# Patient Record
Sex: Male | Born: 1995
Health system: Southern US, Community
[De-identification: ages and names within clinical notes are randomized; demographics above are authoritative.]

## PROBLEM LIST (undated history)

## (undated) DIAGNOSIS — F419 Anxiety disorder, unspecified: Secondary | ICD-10-CM

## (undated) DIAGNOSIS — L80 Vitiligo: Secondary | ICD-10-CM

## (undated) HISTORY — DX: Vitiligo: L80

---
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
## 2017-04-02 ENCOUNTER — Encounter (HOSPITAL_COMMUNITY): Payer: Self-pay | Admitting: Emergency Medicine

## 2017-04-02 ENCOUNTER — Ambulatory Visit (INDEPENDENT_AMBULATORY_CARE_PROVIDER_SITE_OTHER): Payer: BLUE CROSS/BLUE SHIELD

## 2017-04-02 ENCOUNTER — Ambulatory Visit (HOSPITAL_COMMUNITY)
Admission: EM | Admit: 2017-04-02 | Discharge: 2017-04-02 | Disposition: A | Discharge status: Home / Self Care | Payer: BLUE CROSS/BLUE SHIELD | Attending: Family Medicine | Admitting: Family Medicine

## 2017-04-02 DIAGNOSIS — S82431A Displaced oblique fracture of shaft of right fibula, initial encounter for closed fracture: Secondary | ICD-10-CM | POA: Diagnosis not present

## 2017-04-02 DIAGNOSIS — M25571 Pain in right ankle and joints of right foot: Secondary | ICD-10-CM | POA: Diagnosis not present

## 2017-04-02 DIAGNOSIS — S82891A Other fracture of right lower leg, initial encounter for closed fracture: Secondary | ICD-10-CM

## 2017-04-02 DIAGNOSIS — S82831A Other fracture of upper and lower end of right fibula, initial encounter for closed fracture: Secondary | ICD-10-CM | POA: Diagnosis not present

## 2017-04-02 DIAGNOSIS — W101XXA Fall (on)(from) sidewalk curb, initial encounter: Secondary | ICD-10-CM | POA: Diagnosis not present

## 2017-04-02 MED ORDER — KETOROLAC TROMETHAMINE 60 MG/2ML IM SOLN
60.0000 mg | Freq: Once | INTRAMUSCULAR | Status: AC
  Administered 2017-04-02: 60 mg via INTRAMUSCULAR

## 2017-04-02 MED ORDER — KETOROLAC TROMETHAMINE 60 MG/2ML IM SOLN
INTRAMUSCULAR | Status: AC
  Filled 2017-04-02: qty 2

## 2017-04-02 MED ORDER — HYDROCODONE-ACETAMINOPHEN 5-325 MG PO TABS: 2.0000 | ORAL_TABLET | Freq: Four times a day (QID) | ORAL | 0 refills | Status: DC | PRN

## 2017-04-02 NOTE — Discharge Instructions (Addendum)
You may take 500mg  Tylenol with ibuprofen 600mg  every 6 hours for pain and inflammation. Use hydrocodone if Tylenol and ibuprofen are not controlling your pain. Please contact your insurance provider to check for a consult with an orthopedic practice. They will take over care of your ankle fracture. Use your crutches in the meantime, do not bear weight on your right foot.

## 2017-04-02 NOTE — ED Triage Notes (Signed)
PT C/O: reports he inverted right ankle this morning around 0830 - 0900 while stepping off a curb.   Sx include: swelling and pain  TAKING MEDS: none   A&O x4... NAD... Ambulatory

## 2017-04-02 NOTE — ED Provider Notes (Signed)
  MRN: [id number] DOB: 04-27-1995  Subjective:   Zachary Gill is a 22 y.o. male presenting for suffering a right ankle injury today. Patient stepped off a curb, rolled his right ankle, felt a pop and immediate severe sharp pain. Pain has been constant. He came to our clinic immediately.   Zachary Gill has No Known Allergies.  Zachary Gill  has no past medical history on file. Also  has a past surgical history that includes laparoscopic appendectomy (N/A, 08/01/2015).  Objective:   Vitals: BP 134/74 (BP Location: Left Arm)   Pulse 84   Temp 98.5 F (36.9 C) (Oral)   Resp 20   SpO2 97%   Physical Exam  Constitutional: He is oriented to person, place, and time. He appears well-developed and well-nourished.  Cardiovascular: Normal rate.  Pulmonary/Chest: Effort normal.  Musculoskeletal:       Right ankle: He exhibits decreased range of motion, swelling and ecchymosis. He exhibits no deformity, no laceration and normal pulse. Tenderness. Lateral malleolus, medial malleolus and AITFL tenderness found. No CF ligament, no posterior TFL, no head of 5th metatarsal and no proximal fibula tenderness found. Achilles tendon exhibits no pain and no defect.  Neurological: He is alert and oriented to person, place, and time.   Dg Ankle Complete Right  Result Date: 04/02/2017 CLINICAL DATA:  Pain following rolling type injury EXAM: RIGHT ANKLE - COMPLETE 3+ VIEW COMPARISON:  None. FINDINGS: Frontal, oblique, and lateral views obtained. There is soft tissue swelling laterally. There is an obliquely oriented fracture of the distal fibula with alignment near anatomic in this area. No other fracture is evident. There is a joint effusion. Ankle mortise appears grossly intact. No appreciable joint space narrowing or erosion. IMPRESSION: Obliquely oriented fracture distal fibula with overall alignment near anatomic. Marked soft tissue swelling laterally with joint effusion. Suspect superimposed ligamentous injury. Ankle  mortise appears grossly intact. No apparent arthropathy. These results will be called to the ordering clinician or representative by the Radiologist Assistant, and communication documented in the PACS or zVision Dashboard. Electronically Signed   By: Bretta BangWilliam  Woodruff III M.D.   On: 04/02/2017 11:33     Assessment and Plan :   Closed fracture of right ankle, initial encounter  Acute right ankle pain  Posterior splint applied. Patient is to be non-weight bearing, use APAP and ibu for pain. Hydrocodone for breakthrough pain. He will follow up with an orthopedic practice for management of his ankle fracture.   Wallis BambergMani, Akari Defelice, PA-C 04/02/17 1203

## 2017-04-04 DIAGNOSIS — S8261XD Displaced fracture of lateral malleolus of right fibula, subsequent encounter for closed fracture with routine healing: Secondary | ICD-10-CM | POA: Diagnosis not present

## 2017-04-04 DIAGNOSIS — M25571 Pain in right ankle and joints of right foot: Secondary | ICD-10-CM | POA: Diagnosis not present

## 2017-04-09 DIAGNOSIS — R6889 Other general symptoms and signs: Secondary | ICD-10-CM | POA: Diagnosis not present

## 2017-04-09 DIAGNOSIS — J101 Influenza due to other identified influenza virus with other respiratory manifestations: Secondary | ICD-10-CM | POA: Diagnosis not present

## 2017-04-18 DIAGNOSIS — S8261XD Displaced fracture of lateral malleolus of right fibula, subsequent encounter for closed fracture with routine healing: Secondary | ICD-10-CM | POA: Diagnosis not present

## 2017-04-23 DIAGNOSIS — J069 Acute upper respiratory infection, unspecified: Secondary | ICD-10-CM | POA: Diagnosis not present

## 2017-05-06 DIAGNOSIS — L739 Follicular disorder, unspecified: Secondary | ICD-10-CM | POA: Diagnosis not present

## 2017-05-16 DIAGNOSIS — S8261XD Displaced fracture of lateral malleolus of right fibula, subsequent encounter for closed fracture with routine healing: Secondary | ICD-10-CM | POA: Diagnosis not present

## 2017-05-16 DIAGNOSIS — M79671 Pain in right foot: Secondary | ICD-10-CM | POA: Diagnosis not present

## 2017-06-13 DIAGNOSIS — S8261XD Displaced fracture of lateral malleolus of right fibula, subsequent encounter for closed fracture with routine healing: Secondary | ICD-10-CM | POA: Diagnosis not present

## 2017-06-13 DIAGNOSIS — M79671 Pain in right foot: Secondary | ICD-10-CM | POA: Diagnosis not present

## 2017-06-13 DIAGNOSIS — M25571 Pain in right ankle and joints of right foot: Secondary | ICD-10-CM | POA: Diagnosis not present

## 2017-08-10 DIAGNOSIS — F332 Major depressive disorder, recurrent severe without psychotic features: Secondary | ICD-10-CM | POA: Diagnosis not present

## 2017-08-16 DIAGNOSIS — F332 Major depressive disorder, recurrent severe without psychotic features: Secondary | ICD-10-CM | POA: Diagnosis not present

## 2017-08-30 DIAGNOSIS — F332 Major depressive disorder, recurrent severe without psychotic features: Secondary | ICD-10-CM | POA: Diagnosis not present

## 2017-09-05 DIAGNOSIS — L80 Vitiligo: Secondary | ICD-10-CM | POA: Diagnosis not present

## 2017-09-06 DIAGNOSIS — F332 Major depressive disorder, recurrent severe without psychotic features: Secondary | ICD-10-CM | POA: Diagnosis not present

## 2017-09-12 DIAGNOSIS — F332 Major depressive disorder, recurrent severe without psychotic features: Secondary | ICD-10-CM | POA: Diagnosis not present

## 2017-09-22 DIAGNOSIS — F419 Anxiety disorder, unspecified: Secondary | ICD-10-CM | POA: Diagnosis not present

## 2017-09-22 DIAGNOSIS — R454 Irritability and anger: Secondary | ICD-10-CM | POA: Diagnosis not present

## 2017-09-22 DIAGNOSIS — F3289 Other specified depressive episodes: Secondary | ICD-10-CM | POA: Diagnosis not present

## 2017-11-05 DIAGNOSIS — R6883 Chills (without fever): Secondary | ICD-10-CM | POA: Diagnosis not present

## 2017-11-07 DIAGNOSIS — R112 Nausea with vomiting, unspecified: Secondary | ICD-10-CM | POA: Diagnosis not present

## 2017-11-08 ENCOUNTER — Encounter (HOSPITAL_COMMUNITY): Admission: EM | Disposition: A | Discharge status: Home / Self Care | Payer: Self-pay | Source: Home / Self Care

## 2017-11-08 ENCOUNTER — Emergency Department (HOSPITAL_COMMUNITY): Payer: BLUE CROSS/BLUE SHIELD | Admitting: Anesthesiology

## 2017-11-08 ENCOUNTER — Emergency Department (HOSPITAL_COMMUNITY): Payer: BLUE CROSS/BLUE SHIELD

## 2017-11-08 ENCOUNTER — Inpatient Hospital Stay (HOSPITAL_COMMUNITY)
Admission: EM | Admit: 2017-11-08 | Discharge: 2017-11-14 | DRG: 987 | Disposition: A | Discharge status: Home / Self Care | Payer: BLUE CROSS/BLUE SHIELD | Attending: Internal Medicine | Admitting: Internal Medicine

## 2017-11-08 ENCOUNTER — Other Ambulatory Visit: Payer: Self-pay

## 2017-11-08 ENCOUNTER — Encounter (HOSPITAL_COMMUNITY): Payer: Self-pay | Admitting: Emergency Medicine

## 2017-11-08 DIAGNOSIS — Z79899 Other long term (current) drug therapy: Secondary | ICD-10-CM | POA: Diagnosis not present

## 2017-11-08 DIAGNOSIS — J9601 Acute respiratory failure with hypoxia: Secondary | ICD-10-CM | POA: Diagnosis not present

## 2017-11-08 DIAGNOSIS — J9690 Respiratory failure, unspecified, unspecified whether with hypoxia or hypercapnia: Secondary | ICD-10-CM | POA: Diagnosis not present

## 2017-11-08 DIAGNOSIS — Z23 Encounter for immunization: Secondary | ICD-10-CM | POA: Diagnosis not present

## 2017-11-08 DIAGNOSIS — F1210 Cannabis abuse, uncomplicated: Secondary | ICD-10-CM | POA: Diagnosis present

## 2017-11-08 DIAGNOSIS — Z4682 Encounter for fitting and adjustment of non-vascular catheter: Secondary | ICD-10-CM | POA: Diagnosis not present

## 2017-11-08 DIAGNOSIS — K561 Intussusception: Secondary | ICD-10-CM | POA: Diagnosis present

## 2017-11-08 DIAGNOSIS — Z79891 Long term (current) use of opiate analgesic: Secondary | ICD-10-CM | POA: Diagnosis not present

## 2017-11-08 DIAGNOSIS — R1110 Vomiting, unspecified: Secondary | ICD-10-CM | POA: Diagnosis not present

## 2017-11-08 DIAGNOSIS — J90 Pleural effusion, not elsewhere classified: Secondary | ICD-10-CM | POA: Diagnosis present

## 2017-11-08 DIAGNOSIS — R112 Nausea with vomiting, unspecified: Secondary | ICD-10-CM | POA: Diagnosis not present

## 2017-11-08 DIAGNOSIS — Z9049 Acquired absence of other specified parts of digestive tract: Secondary | ICD-10-CM | POA: Diagnosis not present

## 2017-11-08 DIAGNOSIS — Z4659 Encounter for fitting and adjustment of other gastrointestinal appliance and device: Secondary | ICD-10-CM

## 2017-11-08 DIAGNOSIS — R079 Chest pain, unspecified: Secondary | ICD-10-CM | POA: Diagnosis not present

## 2017-11-08 DIAGNOSIS — L80 Vitiligo: Secondary | ICD-10-CM | POA: Diagnosis not present

## 2017-11-08 DIAGNOSIS — J984 Other disorders of lung: Secondary | ICD-10-CM | POA: Diagnosis not present

## 2017-11-08 DIAGNOSIS — Z789 Other specified health status: Secondary | ICD-10-CM | POA: Diagnosis not present

## 2017-11-08 DIAGNOSIS — J189 Pneumonia, unspecified organism: Secondary | ICD-10-CM

## 2017-11-08 DIAGNOSIS — R509 Fever, unspecified: Secondary | ICD-10-CM

## 2017-11-08 DIAGNOSIS — R0602 Shortness of breath: Secondary | ICD-10-CM | POA: Diagnosis not present

## 2017-11-08 DIAGNOSIS — F17290 Nicotine dependence, other tobacco product, uncomplicated: Secondary | ICD-10-CM | POA: Diagnosis not present

## 2017-11-08 DIAGNOSIS — R918 Other nonspecific abnormal finding of lung field: Secondary | ICD-10-CM | POA: Diagnosis not present

## 2017-11-08 DIAGNOSIS — F1990 Other psychoactive substance use, unspecified, uncomplicated: Secondary | ICD-10-CM | POA: Diagnosis not present

## 2017-11-08 DIAGNOSIS — F1290 Cannabis use, unspecified, uncomplicated: Secondary | ICD-10-CM | POA: Diagnosis not present

## 2017-11-08 HISTORY — PX: LAPAROSCOPY: SHX197

## 2017-11-08 LAB — URINALYSIS, ROUTINE W REFLEX MICROSCOPIC
Bacteria, UA: NONE SEEN
Bilirubin Urine: NEGATIVE
GLUCOSE, UA: NEGATIVE mg/dL
Hgb urine dipstick: NEGATIVE
Ketones, ur: 80 mg/dL — AB
Leukocytes, UA: NEGATIVE
Nitrite: NEGATIVE
PH: 8 (ref 5.0–8.0)
Protein, ur: >=300 mg/dL — AB
SPECIFIC GRAVITY, URINE: 1.035 — AB (ref 1.005–1.030)

## 2017-11-08 LAB — CBC
HCT: 39.1 % (ref 39.0–52.0)
Hemoglobin: 13.6 g/dL (ref 13.0–17.0)
MCH: 32.9 pg (ref 26.0–34.0)
MCHC: 34.8 g/dL (ref 30.0–36.0)
MCV: 94.7 fL (ref 78.0–100.0)
PLATELETS: 283 10*3/uL (ref 150–400)
RBC: 4.13 MIL/uL — AB (ref 4.22–5.81)
RDW: 12.1 % (ref 11.5–15.5)
WBC: 15.8 10*3/uL — ABNORMAL HIGH (ref 4.0–10.5)

## 2017-11-08 LAB — COMPREHENSIVE METABOLIC PANEL
ALK PHOS: 118 U/L (ref 38–126)
ALT: 18 U/L (ref 0–44)
AST: 26 U/L (ref 15–41)
Albumin: 3.1 g/dL — ABNORMAL LOW (ref 3.5–5.0)
Anion gap: 15 (ref 5–15)
BUN: 13 mg/dL (ref 6–20)
CALCIUM: 9.6 mg/dL (ref 8.9–10.3)
CO2: 23 mmol/L (ref 22–32)
CREATININE: 0.99 mg/dL (ref 0.61–1.24)
Chloride: 103 mmol/L (ref 98–111)
GFR calc non Af Amer: >60 mL/min (ref >60)
Glucose, Bld: 126 mg/dL — ABNORMAL HIGH (ref 70–99)
Potassium: 3.8 mmol/L (ref 3.5–5.1)
SODIUM: 141 mmol/L (ref 135–145)
Total Bilirubin: 1.3 mg/dL — ABNORMAL HIGH (ref 0.3–1.2)
Total Protein: 7 g/dL (ref 6.5–8.1)

## 2017-11-08 LAB — I-STAT CG4 LACTIC ACID, ED
LACTIC ACID, VENOUS: 1.33 mmol/L (ref 0.5–1.9)
Lactic Acid, Venous: 3.02 mmol/L (ref 0.5–1.9)

## 2017-11-08 LAB — LIPASE, BLOOD: LIPASE: 23 U/L (ref 11–51)

## 2017-11-08 SURGERY — LAPAROSCOPY, DIAGNOSTIC
Anesthesia: General | Site: Abdomen

## 2017-11-08 SURGERY — LAPAROSCOPY, DIAGNOSTIC
Anesthesia: General

## 2017-11-08 MED ORDER — OXYCODONE HCL 5 MG PO TABS: 5.0000 mg | ORAL_TABLET | Freq: Once | ORAL | Status: DC | PRN

## 2017-11-08 MED ORDER — MIDAZOLAM HCL 2 MG/2ML IJ SOLN
INTRAMUSCULAR | Status: AC
  Filled 2017-11-08: qty 2

## 2017-11-08 MED ORDER — SODIUM CHLORIDE 0.9 % IV SOLN
2.0000 g | Freq: Once | INTRAVENOUS | Status: AC
  Administered 2017-11-08: 2 g via INTRAVENOUS
  Filled 2017-11-08: qty 20

## 2017-11-08 MED ORDER — FENTANYL CITRATE (PF) 250 MCG/5ML IJ SOLN
INTRAMUSCULAR | Status: AC
  Filled 2017-11-08: qty 5

## 2017-11-08 MED ORDER — SODIUM CHLORIDE 0.9 % IV SOLN
500.0000 mg | Freq: Once | INTRAVENOUS | Status: DC
  Filled 2017-11-08: qty 500

## 2017-11-08 MED ORDER — ARTIFICIAL TEARS OPHTHALMIC OINT
TOPICAL_OINTMENT | OPHTHALMIC | Status: AC
  Filled 2017-11-08: qty 3.5

## 2017-11-08 MED ORDER — LIDOCAINE 2% (20 MG/ML) 5 ML SYRINGE
INTRAMUSCULAR | Status: AC
  Filled 2017-11-08: qty 5

## 2017-11-08 MED ORDER — ROCURONIUM BROMIDE 50 MG/5ML IV SOSY
PREFILLED_SYRINGE | INTRAVENOUS | Status: DC | PRN
  Administered 2017-11-08: 20 mg via INTRAVENOUS
  Administered 2017-11-08: 40 mg via INTRAVENOUS
  Administered 2017-11-08: 10 mg via INTRAVENOUS

## 2017-11-08 MED ORDER — SODIUM CHLORIDE 0.9 % IV BOLUS
1000.0000 mL | Freq: Once | INTRAVENOUS | Status: AC
  Administered 2017-11-08: 1000 mL via INTRAVENOUS

## 2017-11-08 MED ORDER — ROCURONIUM BROMIDE 50 MG/5ML IV SOSY
PREFILLED_SYRINGE | INTRAVENOUS | Status: AC
  Filled 2017-11-08: qty 5

## 2017-11-08 MED ORDER — ONDANSETRON HCL 4 MG/2ML IJ SOLN
4.0000 mg | Freq: Once | INTRAMUSCULAR | Status: AC
  Administered 2017-11-08: 4 mg via INTRAVENOUS
  Filled 2017-11-08: qty 2

## 2017-11-08 MED ORDER — SODIUM CHLORIDE 0.9 % IV SOLN
INTRAVENOUS | Status: DC
  Administered 2017-11-09: 14:00:00 via INTRAVENOUS
  Administered 2017-11-09: 100 mL/h via INTRAVENOUS
  Administered 2017-11-10 (×2): via INTRAVENOUS
  Administered 2017-11-11: 1000 mL via INTRAVENOUS

## 2017-11-08 MED ORDER — PROPOFOL 10 MG/ML IV BOLUS
INTRAVENOUS | Status: DC | PRN
  Administered 2017-11-08: 200 mg via INTRAVENOUS

## 2017-11-08 MED ORDER — SUCCINYLCHOLINE CHLORIDE 20 MG/ML IJ SOLN
INTRAMUSCULAR | Status: DC | PRN
  Administered 2017-11-08: 120 mg via INTRAVENOUS

## 2017-11-08 MED ORDER — FENTANYL CITRATE (PF) 100 MCG/2ML IJ SOLN
INTRAMUSCULAR | Status: DC | PRN
  Administered 2017-11-08: 25 ug via INTRAVENOUS
  Administered 2017-11-08: 50 ug via INTRAVENOUS
  Administered 2017-11-08: 25 ug via INTRAVENOUS
  Administered 2017-11-08: 50 ug via INTRAVENOUS
  Administered 2017-11-08: 75 ug via INTRAVENOUS
  Administered 2017-11-08 (×2): 25 ug via INTRAVENOUS

## 2017-11-08 MED ORDER — SODIUM CHLORIDE 0.9 % IV SOLN
Freq: Once | INTRAVENOUS | Status: AC
  Administered 2017-11-08: 21:00:00 via INTRAVENOUS

## 2017-11-08 MED ORDER — BUPIVACAINE-EPINEPHRINE 0.25% -1:200000 IJ SOLN
INTRAMUSCULAR | Status: DC | PRN
  Administered 2017-11-08: 17 mL

## 2017-11-08 MED ORDER — DEXAMETHASONE SODIUM PHOSPHATE 10 MG/ML IJ SOLN
INTRAMUSCULAR | Status: AC
  Filled 2017-11-08: qty 1

## 2017-11-08 MED ORDER — ARTIFICIAL TEARS OPHTHALMIC OINT
TOPICAL_OINTMENT | OPHTHALMIC | Status: DC | PRN
  Administered 2017-11-08: 1 via OPHTHALMIC

## 2017-11-08 MED ORDER — BUPIVACAINE-EPINEPHRINE (PF) 0.25% -1:200000 IJ SOLN
INTRAMUSCULAR | Status: AC
  Filled 2017-11-08: qty 30

## 2017-11-08 MED ORDER — ONDANSETRON HCL 4 MG/2ML IJ SOLN
INTRAMUSCULAR | Status: DC | PRN
  Administered 2017-11-08: 4 mg via INTRAVENOUS

## 2017-11-08 MED ORDER — ACETAMINOPHEN 325 MG PO TABS
650.0000 mg | ORAL_TABLET | Freq: Once | ORAL | Status: AC
  Administered 2017-11-08: 650 mg via ORAL
  Filled 2017-11-08: qty 2

## 2017-11-08 MED ORDER — 0.9 % SODIUM CHLORIDE (POUR BTL) OPTIME
TOPICAL | Status: DC | PRN
  Administered 2017-11-08: 1000 mL

## 2017-11-08 MED ORDER — OXYCODONE HCL 5 MG/5ML PO SOLN: 5.0000 mg | Freq: Once | ORAL | Status: DC | PRN

## 2017-11-08 MED ORDER — FENTANYL CITRATE (PF) 100 MCG/2ML IJ SOLN
25.0000 ug | INTRAMUSCULAR | Status: DC | PRN
  Administered 2017-11-09: 50 ug via INTRAVENOUS

## 2017-11-08 MED ORDER — MIDAZOLAM HCL 5 MG/5ML IJ SOLN
INTRAMUSCULAR | Status: DC | PRN
  Administered 2017-11-08: 2 mg via INTRAVENOUS

## 2017-11-08 MED ORDER — ONDANSETRON HCL 4 MG/2ML IJ SOLN: 4.0000 mg | Freq: Four times a day (QID) | INTRAMUSCULAR | Status: DC | PRN

## 2017-11-08 MED ORDER — LIDOCAINE 2% (20 MG/ML) 5 ML SYRINGE
INTRAMUSCULAR | Status: DC | PRN
  Administered 2017-11-08: 60 mg via INTRAVENOUS

## 2017-11-08 MED ORDER — SUCCINYLCHOLINE CHLORIDE 200 MG/10ML IV SOSY
PREFILLED_SYRINGE | INTRAVENOUS | Status: AC
  Filled 2017-11-08: qty 10

## 2017-11-08 MED ORDER — DEXAMETHASONE SODIUM PHOSPHATE 10 MG/ML IJ SOLN
INTRAMUSCULAR | Status: DC | PRN
  Administered 2017-11-08: 5 mg via INTRAVENOUS

## 2017-11-08 MED ORDER — ONDANSETRON HCL 4 MG/2ML IJ SOLN
INTRAMUSCULAR | Status: AC
  Filled 2017-11-08: qty 2

## 2017-11-08 MED ORDER — SUGAMMADEX SODIUM 200 MG/2ML IV SOLN
INTRAVENOUS | Status: DC | PRN
  Administered 2017-11-08: 150 mg via INTRAVENOUS

## 2017-11-08 MED ORDER — GLYCOPYRROLATE 0.2 MG/ML IJ SOLN
INTRAMUSCULAR | Status: DC | PRN
  Administered 2017-11-08: 0.2 mg via INTRAVENOUS

## 2017-11-08 MED ORDER — LACTATED RINGERS IV SOLN
INTRAVENOUS | Status: DC | PRN
  Administered 2017-11-08 (×2): via INTRAVENOUS

## 2017-11-08 MED ORDER — SODIUM CHLORIDE 0.9 % IV SOLN
2.0000 g | INTRAVENOUS | Status: AC
  Administered 2017-11-09 – 2017-11-13 (×5): 2 g via INTRAVENOUS
  Filled 2017-11-08 (×9): qty 20

## 2017-11-08 MED ORDER — IOPAMIDOL (ISOVUE-300) INJECTION 61%
INTRAVENOUS | Status: AC
  Administered 2017-11-08: 100 mL
  Filled 2017-11-08: qty 100

## 2017-11-08 SURGICAL SUPPLY — 37 items
BLADE CLIPPER SURG (BLADE) ×3 IMPLANT
CANISTER SUCT 3000ML PPV (MISCELLANEOUS) ×3 IMPLANT
CHLORAPREP W/TINT 26ML (MISCELLANEOUS) ×3 IMPLANT
COVER SURGICAL LIGHT HANDLE (MISCELLANEOUS) ×3 IMPLANT
DECANTER SPIKE VIAL GLASS SM (MISCELLANEOUS) ×3 IMPLANT
DERMABOND ADVANCED (GAUZE/BANDAGES/DRESSINGS) ×1
DERMABOND ADVANCED .7 DNX12 (GAUZE/BANDAGES/DRESSINGS) ×2 IMPLANT
DRAPE LAPAROSCOPIC ABDOMINAL (DRAPES) ×3 IMPLANT
DRSG TEGADERM 2-3/8X2-3/4 SM (GAUZE/BANDAGES/DRESSINGS) ×9 IMPLANT
ELECT CAUTERY BLADE 6.4 (BLADE) ×6 IMPLANT
ELECT REM PT RETURN 9FT ADLT (ELECTROSURGICAL) ×3
ELECTRODE REM PT RTRN 9FT ADLT (ELECTROSURGICAL) ×2 IMPLANT
GLOVE BIOGEL PI IND STRL 7.0 (GLOVE) ×2 IMPLANT
GLOVE BIOGEL PI IND STRL 8 (GLOVE) ×2 IMPLANT
GLOVE BIOGEL PI INDICATOR 7.0 (GLOVE) ×1
GLOVE BIOGEL PI INDICATOR 8 (GLOVE) ×1
GLOVE ECLIPSE 7.5 STRL STRAW (GLOVE) ×3 IMPLANT
GOWN STRL REUS W/ TWL LRG LVL3 (GOWN DISPOSABLE) ×2 IMPLANT
GOWN STRL REUS W/TWL LRG LVL3 (GOWN DISPOSABLE) ×1
KIT BASIN OR (CUSTOM PROCEDURE TRAY) ×3 IMPLANT
KIT TURNOVER KIT B (KITS) ×3 IMPLANT
NS IRRIG 1000ML POUR BTL (IV SOLUTION) ×3 IMPLANT
PAD ARMBOARD 7.5X6 YLW CONV (MISCELLANEOUS) ×6 IMPLANT
PENCIL BUTTON HOLSTER BLD 10FT (ELECTRODE) ×3 IMPLANT
SLEEVE ENDOPATH XCEL 5M (ENDOMECHANICALS) ×3 IMPLANT
STRIP CLOSURE SKIN 1/2X4 (GAUZE/BANDAGES/DRESSINGS) ×3 IMPLANT
SUT MNCRL AB 4-0 PS2 18 (SUTURE) ×3 IMPLANT
TOWEL OR 17X24 6PK STRL BLUE (TOWEL DISPOSABLE) ×3 IMPLANT
TOWEL OR 17X26 10 PK STRL BLUE (TOWEL DISPOSABLE) ×3 IMPLANT
TRAY FOLEY MTR SLVR 16FR STAT (SET/KITS/TRAYS/PACK) IMPLANT
TRAY FOLEY W/BAG SLVR 14FR (SET/KITS/TRAYS/PACK) ×3 IMPLANT
TRAY LAPAROSCOPIC MC (CUSTOM PROCEDURE TRAY) ×3 IMPLANT
TROCAR XCEL NON-BLD 11X100MML (ENDOMECHANICALS) ×3 IMPLANT
TROCAR XCEL NON-BLD 5MMX100MML (ENDOMECHANICALS) ×3 IMPLANT
TUBE CONNECTING 12X1/4 (SUCTIONS) ×3 IMPLANT
TUBING INSUFFLATION (TUBING) ×3 IMPLANT
YANKAUER SUCT BULB TIP NO VENT (SUCTIONS) ×3 IMPLANT

## 2017-11-08 NOTE — ED Notes (Signed)
Patient transported to CT 

## 2017-11-08 NOTE — Progress Notes (Signed)
Unable to maintain O2 sat above 89-90 with 4 L [place]. Dr. Chaney MallingHodierne called and changed to venti mask at 10 L flow 45%. o2 sat increased to 92 % pt color pink . No distress

## 2017-11-08 NOTE — Anesthesia Procedure Notes (Signed)
Procedure Name: Intubation Date/Time: 11/08/2017 9:08 PM Performed by: Edmonia CaprioAuston, Xavior Niazi M, CRNA Pre-anesthesia Checklist: Patient identified, Emergency Drugs available, Suction available, Timeout performed and Patient being monitored Patient Re-evaluated:Patient Re-evaluated prior to induction Oxygen Delivery Method: Circle system utilized Preoxygenation: Pre-oxygenation with 100% oxygen Induction Type: Rapid sequence, Cricoid Pressure applied and IV induction Laryngoscope Size: Miller and 2 Grade View: Grade I Tube type: Oral Tube size: 7.5 mm Number of attempts: 1 Airway Equipment and Method: Stylet Placement Confirmation: ETT inserted through vocal cords under direct vision,  positive ETCO2 and breath sounds checked- equal and bilateral Secured at: 22 cm Tube secured with: Tape Dental Injury: Teeth and Oropharynx as per pre-operative assessment

## 2017-11-08 NOTE — ED Triage Notes (Signed)
Pt. Stated. Im dying, Lavenia Atlasve been to Dr x 2 with chills, fever, N/V since Tuesday. Im PROBABLY DEHYDRATED and I can't even take medication.

## 2017-11-08 NOTE — ED Notes (Signed)
Patient transported to X-ray 

## 2017-11-08 NOTE — ED Provider Notes (Signed)
[facility] EMERGENCY DEPARTMENT Provider Note   CSN: [id number] Arrival date & time: 11/08/17  1336     History   Chief Complaint Chief Complaint  Patient presents with  . Emesis  . Nausea  . Fever    HPI Zachary Gill is a 22 y.o. male.  22 y/o male with no PMH presents to the ED with a chief complaint of "feeling hot" x 4 days. Patient states he began to feel hot 4 days ago. However, two days ago he began to feel nauseated and reports multiple episodes of emesis, unable to tolerate fluids or solids. He states he's been feeling weak, has a headache, and post tusive emesis along with generalized body aches. He states he's just feeling unwell.Mother has given patient tylenol and motrin symptomatic control of his fever. Patient does report mother was recently sick, she was seen in the hospital for colitis last week and treated with antibiotics. He denies any chest pain, shortness of breath, diarrhea, neck rigidity. He does have a previous surgical history of appendectomy.      History reviewed. No pertinent past medical history.  Patient Active Problem List   Diagnosis Date Noted  . Acute appendicitis 08/01/2015  . Appendicitis, acute 08/01/2015    Past Surgical History:  Procedure Laterality Date  . LAPAROSCOPIC APPENDECTOMY N/A 08/01/2015   Procedure: APPENDECTOMY LAPAROSCOPIC;  Surgeon: Leonia Corona, MD;  Location: [facility] OR;  Service: Pediatrics;  Laterality: N/A;        Home Medications    Prior to Admission medications   Medication Sig Start Date End Date Taking? Authorizing Provider  HYDROcodone-acetaminophen (NORCO/VICODIN) 5-325 MG tablet Take 2 tablets by mouth every 6 (six) hours as needed for severe pain. 04/02/17   Wallis Bamberg, PA-C    Family History No family history on file.  Social History Social History   Tobacco Use  . Smoking status: Never Smoker  . Smokeless tobacco: Never Used  Substance Use Topics  . Alcohol use: Yes      Comment: 14 shots binge drinker  . Drug use: No     Allergies   Patient has no known allergies.   Review of Systems Review of Systems  Constitutional: Positive for chills and fever.  HENT: Negative for postnasal drip, rhinorrhea, sneezing, sore throat and trouble swallowing.   Eyes: Negative for photophobia.  Respiratory: Negative for shortness of breath and wheezing.   Cardiovascular: Negative for chest pain and palpitations.  Gastrointestinal: Positive for abdominal pain, nausea and vomiting. Negative for diarrhea.  Genitourinary: Negative for dysuria and flank pain.  Musculoskeletal: Positive for myalgias. Negative for back pain.  Skin: Negative for rash and wound.  Neurological: Negative for syncope, weakness, light-headedness and headaches.  All other systems reviewed and are negative.    Physical Exam Updated Vital Signs BP (!) 113/46   Pulse 96   Temp (!) 103.2 F (39.6 C) (Oral) Comment: PA notified  Resp 16   Ht 5\' 7"  (1.702 m)   Wt 70.3 kg   SpO2 94%   BMI 24.28 kg/m   Physical Exam  Constitutional: He is oriented to person, place, and time. He appears well-developed and well-nourished. He has a sickly appearance. No distress.  HENT:  Head: Normocephalic and atraumatic.  Mouth/Throat: Uvula is midline and oropharynx is clear and moist. Mucous membranes are dry. No oropharyngeal exudate, posterior oropharyngeal edema, posterior oropharyngeal erythema or tonsillar abscesses. No tonsillar exudate.  Lips appear cracked on presentation.   Eyes: Pupils are  equal, round, and reactive to light. No scleral icterus.  Neck: Normal range of motion.  Cardiovascular: Normal heart sounds.  Pulmonary/Chest: Effort normal and breath sounds normal. He has no wheezes. He exhibits no tenderness.  Abdominal: Soft. Bowel sounds are normal. He exhibits no distension. There is no tenderness.  Musculoskeletal: He exhibits no tenderness or deformity.  Neurological: He is alert  and oriented to person, place, and time.  Skin: Skin is warm and dry.  Nursing note and vitals reviewed.    ED Treatments / Results  Labs (all labs ordered are listed, but only abnormal results are displayed) Labs Reviewed  COMPREHENSIVE METABOLIC PANEL - Abnormal; Notable for the following components:      Result Value   Glucose, Bld 126 (*)    Albumin 3.1 (*)    Total Bilirubin 1.3 (*)    All other components within normal limits  CBC - Abnormal; Notable for the following components:   WBC 15.8 (*)    RBC 4.13 (*)    All other components within normal limits  URINALYSIS, ROUTINE W REFLEX MICROSCOPIC - Abnormal; Notable for the following components:   Color, Urine AMBER (*)    APPearance HAZY (*)    Specific Gravity, Urine 1.035 (*)    Ketones, ur 80 (*)    Protein, ur >=300 (*)    All other components within normal limits  I-STAT CG4 LACTIC ACID, ED - Abnormal; Notable for the following components:   Lactic Acid, Venous 3.02 (*)    All other components within normal limits  CULTURE, BLOOD (ROUTINE X 2)  CULTURE, BLOOD (ROUTINE X 2)  LIPASE, BLOOD  I-STAT CG4 LACTIC ACID, ED    EKG None  Radiology Ct Abdomen Pelvis W Contrast  Result Date: 11/08/2017 CLINICAL DATA:  Nausea and vomiting for 4 days.  Fever and chills. EXAM: CT ABDOMEN AND PELVIS WITH CONTRAST TECHNIQUE: Multidetector CT imaging of the abdomen and pelvis was performed using the standard protocol following bolus administration of intravenous contrast. CONTRAST:  ISOVUE-300 IOPAMIDOL (ISOVUE-300) INJECTION 61% COMPARISON:  None. FINDINGS: Lower chest: Patchy ground-glass opacities scattered in both lung bases, indistinctly marginated. Trace left pleural effusion. Hepatobiliary: Unremarkable Pancreas: Unremarkable Spleen: The spleen measures 10.0 by 5.0 by 13.1 cm (volume = 340 cm^3). No focal splenic abnormality. Adrenals/Urinary Tract: There is contrast medium in the collecting systems on the initial  images, which lower sensitivity in detecting nonobstructive renal calculi. No abnormal renal parenchymal enhancement. Adrenal glands normal. No hydronephrosis or hydroureter. Urinary bladder unremarkable. Stomach/Bowel: Prior appendectomy. There is some scattered air-levels in proximal loops small bowel. A jejuno jejunal intussusception measures about 3.3 cm in length on image 77/7. A specific cause for the intussusception is not well seen. Vascular/Lymphatic: Unremarkable Reproductive: Unremarkable Other: No supplemental non-categorized findings. Musculoskeletal: Unremarkable IMPRESSION: 1. There is an approximately 3.3 cm in length region of jejuno jejunal intussusception in the left upper quadrant. Although a specific cause for this is not well seen, outside of childhood most small bowel intussusceptions have a specific lesion or lead point. There are several scattered air-fluid levels in the proximal small bowel as well, including a borderline dilated loop of jejunum extending to the intussusception. 2. Patchy ground-glass opacities in the lung bases without findings of fibrosis. Possibilities may include atypical pneumonia, acute eosinophilic pneumonia, or acute hypersensitivity reaction. There is no cardiomegaly to suggest a cardiogenic cause. However, there is a trace left pleural effusion. Electronically Signed   By: Annitta Needs.D.  On: 11/08/2017 16:11    Procedures Procedures (including critical care time)  Medications Ordered in ED Medications  acetaminophen (TYLENOL) tablet 650 mg (0 mg Oral Hold 11/08/17 1520)  sodium chloride 0.9 % bolus 1,000 mL (has no administration in time range)  azithromycin (ZITHROMAX) 500 mg in sodium chloride 0.9 % 250 mL IVPB (has no administration in time range)  ondansetron (ZOFRAN) injection 4 mg (4 mg Intravenous Given 11/08/17 1521)  sodium chloride 0.9 % bolus 1,000 mL (0 mLs Intravenous Stopped 11/08/17 1541)  iopamidol (ISOVUE-300) 61 % injection  (100 mLs  Contrast Given 11/08/17 1543)     Initial Impression / Assessment and Plan / ED Course  I have reviewed the triage vital signs and the nursing notes.  Pertinent labs & imaging results that were available during my care of the patient were reviewed by me and considered in my medical decision making (see chart for details).     Patient presents with fever and emesis x 4 days. His CMP showed no electrolyte abnormalities. LFT's are within normal limits. CBC showed leukocytosis at 15.8, patient is currently febrile at 103.2 he has been unable to keep any fluids or solids down for the past 4 days. Lactic acid was 3.02  UA showed no nitrites, leukocytes or bacteria he denies any urinary symptoms.  Zofran, tylenol and bolus ordered.CT abdomen and Pelvis with contrast ordered to r/o infection.  CT Abd & Pelvis showed   1. There is an approximately 3.3 cm in length region of jejuno  jejunal intussusception in the left upper quadrant. Although a  specific cause for this is not well seen, outside of childhood most  small bowel intussusceptions have a specific lesion or lead point.  There are several scattered air-fluid levels in the proximal small  bowel as well, including a borderline dilated loop of jejunum  extending to the intussusception.  2. Patchy ground-glass opacities in the lung bases without findings  of fibrosis. Possibilities may include atypical pneumonia, acute  eosinophilic pneumonia, or acute hypersensitivity reaction. There is  no cardiomegaly to suggest a cardiogenic cause. However, there is a  trace left pleural effusion.    4:35 PM Called placed to surgery, spoke to Dr. Lindie SpruceWyatt who will come in to see patient in the ED. At this time I have started patient on Zithromax for pneumonia, will obtain chest xray.Patient at this time is not symptomatic but fever still elevated.   4:41 PM Patient trasnferred to [employer] K. PA-C at shift change.   Final Clinical  Impressions(s) / ED Diagnoses   Final diagnoses:  Fever, unspecified fever cause    ED Discharge Orders    None       Claude MangesSoto, Hosam Mcfetridge, PA-C 11/08/17 1641    Sabas SousBero, Michael M, MD 11/08/17 1655

## 2017-11-08 NOTE — Op Note (Signed)
OPERATIVE REPORT  DATE OF OPERATION: 11/08/2017  PATIENT:  Zachary Gill  22 y.o. male  PRE-OPERATIVE DIAGNOSIS:  proximal jejunal INTUSSUSCEPTION  POST-OPERATIVE DIAGNOSIS:  Normal small bowel from the ligament of Treitz down to the terminal ileum  INDICATION(S) FOR OPERATION:  Nausea and vomiting and CT scan demonstrating what was thought to be intestinal intussusception  FINDINGS: Normal caliber small bowel from the ligament of Treitz down to the terminal ileum.  No evidence of any infection.  Staple line at the cecum from previous appendectomy is noted.  PROCEDURE:  Procedure(s): LAPAROSCOPY DIAGNOSTIC  SURGEON:  Surgeon(s): Jimmye NormanWyatt, Timon Geissinger, MD  ASSISTANT: None  ANESTHESIA:   general  COMPLICATIONS: None  EBL: 10 ml  BLOOD ADMINISTERED: none  DRAINS: none   SPECIMEN:  No Specimen  COUNTS CORRECT:  YES  PROCEDURE DETAILS: The patient was taken to the operating room and placed on the table in supine position.  After adequate general endotracheal anesthetic was administered, he was prepped and draped in usual sterile manner exposing the abdomen.  A proper timeout was performed identifying the patient and procedure to be performed.  We started with a supraumbilical incision approximately 1/2 cm long down to the midline fascia.  We incised the fascia then subsequently bluntly dissected down into the peritoneal cavity using a Kelly clamp.  A pursestring suture of 0 Vicryl was passed around the fascial opening which secured in place a Hassan cannula which is used insufflated carbon dioxide gas up to a maximal intra-abdominal pressure of 1 5 mmHg.  Right upper quadrant 5 mm cannula in the left lower quadrant 5 mm cannula passed under direct vision.  Once they were all then we placed the patient in multiple orientations noted to get adequate visualization of small bowel.  We started by the patient in Trendelenburg with the left side down looking in the right lower quadrant.  At that  point we could see the staple line from the previous appendectomy we started the terminal ileum and around the small bowel using noncrushing bowel clamps to run the small bowel from the terminal ileum up to the ligament of Treitz.  Although there was some fluid-filled loops of the right lower quadrant which looked initially as if there was some obstruction these appeared to be normal terminal ileal bowel loops and there was no evidence of obstruction and no intussusception.  In various orientations including head of the head down right side up right-side-down a combination of those for we did not find any evidence of intussusception in the small bowel.  The colon appeared to be normal.  The ligament of Treitz could be seen easily and rear-ended at least 4 times but not sure there was also most small bowel obstruction.  Once we completed that we removed all cannulas and tied off the fascia at the supraumbilical site using the pursestring suture which was in place.  0.25% Marcaine was injected at all sites level with Marcaine with epinephrine.  We closed the skin at the supraumbilical site using a running subcuticular stitch of 4-0 Monocryl.  Dermabond, Steri-Strips, and Tegaderm were used to complete the dressings.  All needle counts, sponge counts, and instrument counts were correct.  PATIENT DISPOSITION:  PACU - hemodynamically stable.   Jimmye NormanJames Janiaya Ryser 8/24/201910:30 PM

## 2017-11-08 NOTE — Transfer of Care (Signed)
Immediate Anesthesia Transfer of Care Note  Patient: Zachary Gill  Procedure(s) Performed: LAPAROSCOPY DIAGNOSTIC (N/A Abdomen)  Patient Location: PACU  Anesthesia Type:General  Level of Consciousness: awake and alert   Airway & Oxygen Therapy: Patient Spontanous Breathing and Patient connected to face mask oxygen  Post-op Assessment: Report given to RN, Post -op Vital signs reviewed and stable and Patient moving all extremities  Post vital signs: Reviewed and stable  Last Vitals:  Vitals Value Taken Time  BP 101/46 11/08/2017 10:42 PM  Temp 37.7 C 11/08/2017 10:36 PM  Pulse 112 11/08/2017 10:44 PM  Resp 23 11/08/2017 10:44 PM  SpO2 93 % 11/08/2017 10:44 PM  Vitals shown include unvalidated device data.  Last Pain:  Vitals:   11/08/17 2236  TempSrc:   PainSc: 3          Complications: No apparent anesthesia complications

## 2017-11-08 NOTE — Anesthesia Preprocedure Evaluation (Addendum)
Anesthesia Evaluation  Patient identified by MRN, date of birth, ID band Patient awake    Reviewed: Allergy & Precautions, H&P , NPO status , Patient's Chart, lab work & pertinent test results  Airway Mallampati: II  TM Distance: >3 FB Neck ROM: full    Dental  (+) Teeth Intact, Dental Advisory Given   Pulmonary neg pulmonary ROS,    breath sounds clear to auscultation       Cardiovascular negative cardio ROS   Rhythm:regular Rate:Normal     Neuro/Psych    GI/Hepatic Proximal jejunal intussuseption.  SBO   Endo/Other    Renal/GU      Musculoskeletal   Abdominal   Peds  Hematology   Anesthesia Other Findings   Reproductive/Obstetrics                            Anesthesia Physical Anesthesia Plan  ASA: II  Anesthesia Plan: General   Post-op Pain Management:    Induction: Intravenous, Rapid sequence and Cricoid pressure planned  PONV Risk Score and Plan: 2 and Ondansetron, Treatment may vary due to age or medical condition, Dexamethasone and Midazolam  Airway Management Planned: Oral ETT  Additional Equipment:   Intra-op Plan:   Post-operative Plan: Extubation in OR  Informed Consent: I have reviewed the patients History and Physical, chart, labs and discussed the procedure including the risks, benefits and alternatives for the proposed anesthesia with the patient or authorized representative who has indicated his/her understanding and acceptance.     Plan Discussed with: CRNA, Anesthesiologist and Surgeon  Anesthesia Plan Comments:         Anesthesia Quick Evaluation

## 2017-11-08 NOTE — H&P (Signed)
Zachary Gill is an 22 y.o. male.   Chief Complaint: Nausea and vomiting and fevers HPI: Sick since early this week with fevers and subsequently persistent nausea and vomiting. CT shows intestinal intussusception.  Plan is to go to the OR for diagnostic laparoscopy and resection as needed.  History reviewed. No pertinent past medical history.  Past Surgical History:  Procedure Laterality Date  . LAPAROSCOPIC APPENDECTOMY N/A 08/01/2015   Procedure: APPENDECTOMY LAPAROSCOPIC;  Surgeon: Gerald Stabs, MD;  Location: [facility];  Service: Pediatrics;  Laterality: N/A;    No family history on file. Social History:  reports that he has never smoked. He has never used smokeless tobacco. He reports that he drinks alcohol. He reports that he does not use drugs.  Allergies: No Known Allergies   (Not in a hospital admission)  Results for orders placed or performed during the hospital encounter of 11/08/17 (from the past 48 hour(s))  Lipase, blood     Status: None   Collection Time: 11/08/17  1:46 PM  Result Value Ref Range   Lipase 23 11 - 51 U/L    Comment: Performed at [facility], [address], [place], [place] [address]  Comprehensive metabolic panel     Status: Abnormal   Collection Time: 11/08/17  1:46 PM  Result Value Ref Range   Sodium 141 135 - 145 mmol/L   Potassium 3.8 3.5 - 5.1 mmol/L   Chloride 103 98 - 111 mmol/L   CO2 23 22 - 32 mmol/L   Glucose, Bld 126 (H) 70 - 99 mg/dL   BUN 13 6 - 20 mg/dL   Creatinine, Ser 0.99 0.61 - 1.24 mg/dL   Calcium 9.6 8.9 - 10.3 mg/dL   Total Protein 7.0 6.5 - 8.1 g/dL   Albumin 3.1 (L) 3.5 - 5.0 g/dL   AST 26 15 - 41 U/L   ALT 18 0 - 44 U/L   Alkaline Phosphatase 118 38 - 126 U/L   Total Bilirubin 1.3 (H) 0.3 - 1.2 mg/dL   GFR calc non Af Amer >60 >60 mL/min   GFR calc Af Amer >60 >60 mL/min    Comment: (NOTE) The eGFR has been calculated using the CKD EPI equation. This calculation has not been validated in all clinical  situations. eGFR's persistently <60 mL/min signify possible Chronic Kidney Disease.    Anion gap 15 5 - 15    Comment: Performed at [facility] [address] [place], [place] [address]  CBC     Status: Abnormal   Collection Time: 11/08/17  1:46 PM  Result Value Ref Range   WBC 15.8 (H) 4.0 - 10.5 K/uL   RBC 4.13 (L) 4.22 - 5.81 MIL/uL   Hemoglobin 13.6 13.0 - 17.0 g/dL   HCT 39.1 39.0 - 52.0 %   MCV 94.7 78.0 - 100.0 fL   MCH 32.9 26.0 - 34.0 pg   MCHC 34.8 30.0 - 36.0 g/dL   RDW 12.1 11.5 - 15.5 %   Platelets 283 150 - 400 K/uL    Comment: Performed at [facility], [place] [address] [place], [place] [address]  Urinalysis, Routine w reflex microscopic     Status: Abnormal   Collection Time: 11/08/17  2:10 PM  Result Value Ref Range   Color, Urine AMBER (A) YELLOW    Comment: BIOCHEMICALS MAY BE AFFECTED BY COLOR   APPearance HAZY (A) CLEAR   Specific Gravity, Urine 1.035 (H) 1.005 - 1.030  pH 8.0 5.0 - 8.0   Glucose, UA NEGATIVE NEGATIVE mg/dL   Hgb urine dipstick NEGATIVE NEGATIVE   Bilirubin Urine NEGATIVE NEGATIVE   Ketones, ur 80 (A) NEGATIVE mg/dL   Protein, ur >=300 (A) NEGATIVE mg/dL   Nitrite NEGATIVE NEGATIVE   Leukocytes, UA NEGATIVE NEGATIVE   RBC / HPF 6-10 0 - 5 RBC/hpf   WBC, UA 6-10 0 - 5 WBC/hpf   Bacteria, UA NONE SEEN NONE SEEN   Squamous Epithelial / LPF 0-5 0 - 5   Mucus PRESENT     Comment: Performed at [facility], [place] [address] [place], [place] [address]  I-Stat CG4 Lactic Acid, ED     Status: Abnormal   Collection Time: 11/08/17  2:10 PM  Result Value Ref Range   Lactic Acid, Venous 3.02 (HH) 0.5 - 1.9 mmol/L   Comment NOTIFIED PHYSICIAN   I-Stat CG4 Lactic Acid, ED     Status: None   Collection Time: 11/08/17  4:44 PM  Result Value Ref Range   Lactic Acid, Venous 1.33 0.5 - 1.9 mmol/L   Dg Chest 2 View  Result Date: 11/08/2017 CLINICAL DATA:  Generalized chest pain.  Weakness for 5 days. EXAM: CHEST - 2 VIEW  COMPARISON:  None. FINDINGS: The heart, hila, and mediastinum are normal. No pneumothorax. Bilateral patchy pulmonary infiltrates it in a somewhat perihilar distribution. No nodules or masses. No other acute abnormalities. IMPRESSION: 1. Bilateral pulmonary infiltrates, in a somewhat perihilar distribution most consistent with pneumonia. This could represent a typical multifocal pneumonia. In the appropriate clinical setting, atypical pneumonias such as pneumocystis should be considered. Recommend follow-up to resolution. Electronically Signed   By: Dorise Bullion III M.D   On: 11/08/2017 17:07   Ct Abdomen Pelvis W Contrast  Result Date: 11/08/2017 CLINICAL DATA:  Nausea and vomiting for 4 days.  Fever and chills. EXAM: CT ABDOMEN AND PELVIS WITH CONTRAST TECHNIQUE: Multidetector CT imaging of the abdomen and pelvis was performed using the standard protocol following bolus administration of intravenous contrast. CONTRAST:  114m ISOVUE-300 IOPAMIDOL (ISOVUE-300) INJECTION 61% COMPARISON:  None. FINDINGS: Lower chest: Patchy ground-glass opacities scattered in both lung bases, indistinctly marginated. Trace left pleural effusion. Hepatobiliary: Unremarkable Pancreas: Unremarkable Spleen: The spleen measures 10.0 by 5.0 by 13.1 cm (volume = 340 cm^3). No focal splenic abnormality. Adrenals/Urinary Tract: There is contrast medium in the collecting systems on the initial images, which lower sensitivity in detecting nonobstructive renal calculi. No abnormal renal parenchymal enhancement. Adrenal glands normal. No hydronephrosis or hydroureter. Urinary bladder unremarkable. Stomach/Bowel: Prior appendectomy. There is some scattered air-levels in proximal loops small bowel. A jejuno jejunal intussusception measures about 3.3 cm in length on image 77/7. A specific cause for the intussusception is not well seen. Vascular/Lymphatic: Unremarkable Reproductive: Unremarkable Other: No supplemental non-categorized findings.  Musculoskeletal: Unremarkable IMPRESSION: 1. There is an approximately 3.3 cm in length region of jejuno jejunal intussusception in the left upper quadrant. Although a specific cause for this is not well seen, outside of childhood most small bowel intussusceptions have a specific lesion or lead point. There are several scattered air-fluid levels in the proximal small bowel as well, including a borderline dilated loop of jejunum extending to the intussusception. 2. Patchy ground-glass opacities in the lung bases without findings of fibrosis. Possibilities may include atypical pneumonia, acute eosinophilic pneumonia, or acute hypersensitivity reaction. There is no cardiomegaly to suggest a cardiogenic cause. However, there is a trace left pleural effusion. Electronically Signed   By:  Van Clines M.D.   On: 11/08/2017 16:11    Review of Systems  Constitutional: Positive for chills and fever.  Gastrointestinal: Positive for nausea and vomiting.  All other systems reviewed and are negative.   Blood pressure (!) 113/46, pulse 96, temperature (!) 103.2 F (39.6 C), temperature source Oral, resp. rate 16, height '5\' 7"'$  (1.702 m), weight 70.3 kg, SpO2 94 %. Physical Exam  Vitals reviewed. Constitutional: He is oriented to person, place, and time. He appears well-developed and well-nourished.  HENT:  Head: Normocephalic and atraumatic.  Eyes: Pupils are equal, round, and reactive to light. Conjunctivae and EOM are normal.  Neck: Normal range of motion. Neck supple.  Cardiovascular: Normal rate, regular rhythm and normal heart sounds.  Respiratory: Effort normal and breath sounds normal.  GI: Soft. Normal appearance. Bowel sounds are decreased. There is no tenderness. There is no rigidity, no rebound and no guarding.    Genitourinary: Penis normal.  Musculoskeletal: Normal range of motion.  Neurological: He is alert and oriented to person, place, and time. He has normal reflexes.  Skin: Skin is  warm and dry.  Vitiligo on hands, arms, lower abdomen  Psychiatric: He has a normal mood and affect. His behavior is normal. Judgment and thought content normal.     Assessment/Plan Fevers of unknown etiology  Nausea and vomiting since Wednesday Proximal jejunal intussusception with obstruction  Diagnostic laparoscopy, exploratory laparotomy, possible bowel resection.  Judeth Horn, MD 11/08/2017, 5:18 PM

## 2017-11-09 LAB — BASIC METABOLIC PANEL
Anion gap: 8 (ref 5–15)
BUN: 12 mg/dL (ref 6–20)
CHLORIDE: 107 mmol/L (ref 98–111)
CO2: 26 mmol/L (ref 22–32)
CREATININE: 0.91 mg/dL (ref 0.61–1.24)
Calcium: 8.7 mg/dL — ABNORMAL LOW (ref 8.9–10.3)
GFR calc Af Amer: >60 mL/min (ref >60)
GFR calc non Af Amer: >60 mL/min (ref >60)
Glucose, Bld: 183 mg/dL — ABNORMAL HIGH (ref 70–99)
POTASSIUM: 4.5 mmol/L (ref 3.5–5.1)
SODIUM: 141 mmol/L (ref 135–145)

## 2017-11-09 LAB — CBC
HCT: 33.3 % — ABNORMAL LOW (ref 39.0–52.0)
Hemoglobin: 11.2 g/dL — ABNORMAL LOW (ref 13.0–17.0)
MCH: 32.8 pg (ref 26.0–34.0)
MCHC: 33.6 g/dL (ref 30.0–36.0)
MCV: 97.7 fL (ref 78.0–100.0)
PLATELETS: 232 10*3/uL (ref 150–400)
RBC: 3.41 MIL/uL — ABNORMAL LOW (ref 4.22–5.81)
RDW: 12.9 % (ref 11.5–15.5)
WBC: 12.2 10*3/uL — ABNORMAL HIGH (ref 4.0–10.5)

## 2017-11-09 MED ORDER — FENTANYL CITRATE (PF) 100 MCG/2ML IJ SOLN
INTRAMUSCULAR | Status: AC
  Filled 2017-11-09: qty 2

## 2017-11-09 MED ORDER — ONDANSETRON HCL 4 MG/2ML IJ SOLN
4.0000 mg | Freq: Four times a day (QID) | INTRAMUSCULAR | Status: DC | PRN
  Administered 2017-11-10 – 2017-11-11 (×3): 4 mg via INTRAVENOUS
  Filled 2017-11-09 (×3): qty 2

## 2017-11-09 MED ORDER — ENOXAPARIN SODIUM 40 MG/0.4ML ~~LOC~~ SOLN
40.0000 mg | Freq: Every day | SUBCUTANEOUS | Status: DC
  Administered 2017-11-09 – 2017-11-13 (×5): 40 mg via SUBCUTANEOUS
  Filled 2017-11-09 (×5): qty 0.4

## 2017-11-09 MED ORDER — ACETAMINOPHEN 500 MG PO TABS
1000.0000 mg | ORAL_TABLET | Freq: Three times a day (TID) | ORAL | Status: DC
  Administered 2017-11-09 – 2017-11-12 (×9): 1000 mg via ORAL
  Filled 2017-11-09 (×11): qty 2

## 2017-11-09 MED ORDER — GABAPENTIN 300 MG PO CAPS
300.0000 mg | ORAL_CAPSULE | Freq: Two times a day (BID) | ORAL | Status: DC
  Administered 2017-11-09 – 2017-11-10 (×5): 300 mg via ORAL
  Filled 2017-11-09 (×5): qty 1

## 2017-11-09 MED ORDER — TACROLIMUS 0.1 % EX OINT
1.0000 "application " | TOPICAL_OINTMENT | Freq: Every day | CUTANEOUS | Status: DC
  Filled 2017-11-09: qty 30
  Filled 2017-11-09: qty 1
  Filled 2017-11-09 (×8): qty 30

## 2017-11-09 MED ORDER — FLUOXETINE HCL 20 MG PO CAPS
20.0000 mg | ORAL_CAPSULE | Freq: Every day | ORAL | Status: DC
  Administered 2017-11-09 – 2017-11-10 (×2): 20 mg via ORAL
  Filled 2017-11-09 (×2): qty 1

## 2017-11-09 MED ORDER — METHOCARBAMOL 500 MG PO TABS
500.0000 mg | ORAL_TABLET | Freq: Four times a day (QID) | ORAL | Status: DC | PRN
  Administered 2017-11-09: 500 mg via ORAL
  Filled 2017-11-09: qty 1

## 2017-11-09 MED ORDER — POLYETHYLENE GLYCOL 3350 17 G PO PACK: 17.0000 g | PACK | Freq: Every day | ORAL | Status: DC | PRN

## 2017-11-09 MED ORDER — PNEUMOCOCCAL VAC POLYVALENT 25 MCG/0.5ML IJ INJ
0.5000 mL | INJECTION | INTRAMUSCULAR | Status: AC
  Administered 2017-11-12: 0.5 mL via INTRAMUSCULAR
  Filled 2017-11-09: qty 0.5

## 2017-11-09 MED ORDER — AZITHROMYCIN 500 MG PO TABS
250.0000 mg | ORAL_TABLET | Freq: Every day | ORAL | Status: DC
  Administered 2017-11-10: 250 mg via ORAL
  Filled 2017-11-09: qty 1

## 2017-11-09 MED ORDER — TACROLIMUS 0.1 % EX OINT
1.0000 "application " | TOPICAL_OINTMENT | Freq: Every day | CUTANEOUS | Status: DC
  Filled 2017-11-09 (×9): qty 30

## 2017-11-09 MED ORDER — AZITHROMYCIN 1 G PO PACK: 1.0000 g | PACK | Freq: Once | ORAL | Status: DC

## 2017-11-09 MED ORDER — CELECOXIB 200 MG PO CAPS
200.0000 mg | ORAL_CAPSULE | Freq: Two times a day (BID) | ORAL | Status: DC
  Administered 2017-11-09 – 2017-11-10 (×5): 200 mg via ORAL
  Filled 2017-11-09 (×6): qty 1

## 2017-11-09 MED ORDER — AZITHROMYCIN 250 MG PO TABS
1000.0000 mg | ORAL_TABLET | Freq: Once | ORAL | Status: AC
  Administered 2017-11-09: 1000 mg via ORAL
  Filled 2017-11-09: qty 4

## 2017-11-09 MED ORDER — OXYCODONE HCL 5 MG PO TABS
2.5000 mg | ORAL_TABLET | ORAL | Status: DC | PRN
  Administered 2017-11-09 – 2017-11-10 (×4): 5 mg via ORAL
  Filled 2017-11-09 (×4): qty 1

## 2017-11-09 MED ORDER — HYDROMORPHONE HCL 1 MG/ML IJ SOLN: 0.5000 mg | INTRAMUSCULAR | Status: DC | PRN

## 2017-11-09 MED ORDER — ONDANSETRON 4 MG PO TBDP: 4.0000 mg | ORAL_TABLET | Freq: Four times a day (QID) | ORAL | Status: DC | PRN

## 2017-11-09 NOTE — Progress Notes (Signed)
Patient on 4.5L of O2 via nasal cannula from start of shift. RN noticed patients oxygen saturation ranging from 84-92%, fluctuating. Patient is sitting up in the bed and complains of SOB only when laying flat. Patient is no signs of distress. RN notified RT, Tim.   Tim at bedside. Recommended placing patient on a non-rebreather mask. Implemented by RT.  RN notified on call general surgery MD, Andrey CampanileWilson.

## 2017-11-09 NOTE — Progress Notes (Signed)
Placed in [phone] [id number] . O2 at 45% 10 L venturi mask, pt in  no distress . Placed on Oximetry monitor. Reading 92-93 %. Resting with HOB at 10 % on left side. Pain level 4/10 after pain med.

## 2017-11-09 NOTE — Progress Notes (Signed)
1 Day Post-Op   Subjective/Chief Complaint: No complaints   Objective: Vital signs in last 24 hours: Temp:  [97.4 F (36.3 C)-103.2 F (39.6 C)] 97.4 F (36.3 C) (08/25 0518) Pulse Rate:  [92-124] 92 (08/25 0518) Resp:  [12-32] 16 (08/25 0518) BP: (100-131)/(41-78) 112/66 (08/25 0518) SpO2:  [87 %-98 %] 93 % (08/25 0518) FiO2 (%):  [45 %] 45 % (08/25 0033) Weight:  [70.3 kg] 70.3 kg (08/24 1342) Last BM Date: 11/07/17  Intake/Output from previous day: 08/24 0701 - 08/25 0700 In: 2500 [I.V.:1500; IV Piggyback:1000] Out: 410 [Urine:375; Blood:15] Intake/Output this shift: No intake/output data recorded.  General appearance: alert and cooperative Resp: clear to auscultation bilaterally Cardio: regular rate and rhythm GI: soft, nontender  Lab Results:  Recent Labs    11/08/17 1346 11/09/17 0345  WBC 15.8* 12.2*  HGB 13.6 11.2*  HCT 39.1 33.3*  PLT 283 232   BMET Recent Labs    11/08/17 1346 11/09/17 0345  NA 141 141  K 3.8 4.5  CL 103 107  CO2 23 26  GLUCOSE 126* 183*  BUN 13 12  CREATININE 0.99 0.91  CALCIUM 9.6 8.7*   PT/INR No results for input(s): LABPROT, INR in the last 72 hours. ABG No results for input(s): PHART, HCO3 in the last 72 hours.  Invalid input(s): PCO2, PO2  Studies/Results: Dg Chest 2 View  Result Date: 11/08/2017 CLINICAL DATA:  Generalized chest pain.  Weakness for 5 days. EXAM: CHEST - 2 VIEW COMPARISON:  None. FINDINGS: The heart, hila, and mediastinum are normal. No pneumothorax. Bilateral patchy pulmonary infiltrates it in a somewhat perihilar distribution. No nodules or masses. No other acute abnormalities. IMPRESSION: 1. Bilateral pulmonary infiltrates, in a somewhat perihilar distribution most consistent with pneumonia. This could represent a typical multifocal pneumonia. In the appropriate clinical setting, atypical pneumonias such as pneumocystis should be considered. Recommend follow-up to resolution. Electronically Signed    By: Gerome Samavid  Williams III M.D   On: 11/08/2017 17:07   Ct Abdomen Pelvis W Contrast  Result Date: 11/08/2017 CLINICAL DATA:  Nausea and vomiting for 4 days.  Fever and chills. EXAM: CT ABDOMEN AND PELVIS WITH CONTRAST TECHNIQUE: Multidetector CT imaging of the abdomen and pelvis was performed using the standard protocol following bolus administration of intravenous contrast. CONTRAST:  100mL ISOVUE-300 IOPAMIDOL (ISOVUE-300) INJECTION 61% COMPARISON:  None. FINDINGS: Lower chest: Patchy ground-glass opacities scattered in both lung bases, indistinctly marginated. Trace left pleural effusion. Hepatobiliary: Unremarkable Pancreas: Unremarkable Spleen: The spleen measures 10.0 by 5.0 by 13.1 cm (volume = 340 cm^3). No focal splenic abnormality. Adrenals/Urinary Tract: There is contrast medium in the collecting systems on the initial images, which lower sensitivity in detecting nonobstructive renal calculi. No abnormal renal parenchymal enhancement. Adrenal glands normal. No hydronephrosis or hydroureter. Urinary bladder unremarkable. Stomach/Bowel: Prior appendectomy. There is some scattered air-levels in proximal loops small bowel. A jejuno jejunal intussusception measures about 3.3 cm in length on image 77/7. A specific cause for the intussusception is not well seen. Vascular/Lymphatic: Unremarkable Reproductive: Unremarkable Other: No supplemental non-categorized findings. Musculoskeletal: Unremarkable IMPRESSION: 1. There is an approximately 3.3 cm in length region of jejuno jejunal intussusception in the left upper quadrant. Although a specific cause for this is not well seen, outside of childhood most small bowel intussusceptions have a specific lesion or lead point. There are several scattered air-fluid levels in the proximal small bowel as well, including a borderline dilated loop of jejunum extending to the intussusception. 2. Patchy ground-glass opacities in  the lung bases without findings of fibrosis.  Possibilities may include atypical pneumonia, acute eosinophilic pneumonia, or acute hypersensitivity reaction. There is no cardiomegaly to suggest a cardiogenic cause. However, there is a trace left pleural effusion. Electronically Signed   By: Gaylyn Rong M.D.   On: 11/08/2017 16:11    Anti-infectives: Anti-infectives (From admission, onward)   Start     Dose/Rate Route Frequency Ordered Stop   11/10/17 1000  azithromycin (ZITHROMAX) tablet 250 mg     250 mg Oral Daily 11/09/17 0039     11/09/17 1700  cefTRIAXone (ROCEPHIN) 2 g in sodium chloride 0.9 % 100 mL IVPB     2 g 200 mL/hr over 30 Minutes Intravenous Every 24 hours 11/08/17 1718     11/09/17 0100  azithromycin (ZITHROMAX) tablet 1,000 mg     1,000 mg Oral  Once 11/09/17 0046 11/09/17 0138   11/09/17 0045  azithromycin (ZITHROMAX) powder 1 g  Status:  Discontinued     1 g Oral  Once 11/09/17 0039 11/09/17 0046   11/08/17 1730  cefTRIAXone (ROCEPHIN) 2 g in sodium chloride 0.9 % 100 mL IVPB     2 g 200 mL/hr over 30 Minutes Intravenous  Once 11/08/17 1717 11/08/17 1821   11/08/17 1630  azithromycin (ZITHROMAX) 500 mg in sodium chloride 0.9 % 250 mL IVPB     500 mg 250 mL/hr over 60 Minutes Intravenous  Once 11/08/17 1616        Assessment/Plan: s/p Procedure(s): LAPAROSCOPY DIAGNOSTIC (N/A) stay on clears until breathing is improved  O2 sats low. Presumed Pneumonia. On azithromycin and rocephin. If breathing worsens then will transfer to icu and consult pulmonology OOB  LOS: 0 days    TOTH III,PAUL S 11/09/2017

## 2017-11-10 ENCOUNTER — Observation Stay (HOSPITAL_COMMUNITY): Payer: BLUE CROSS/BLUE SHIELD

## 2017-11-10 ENCOUNTER — Inpatient Hospital Stay (HOSPITAL_COMMUNITY): Payer: BLUE CROSS/BLUE SHIELD

## 2017-11-10 DIAGNOSIS — Z79899 Other long term (current) drug therapy: Secondary | ICD-10-CM | POA: Diagnosis not present

## 2017-11-10 DIAGNOSIS — Z9049 Acquired absence of other specified parts of digestive tract: Secondary | ICD-10-CM | POA: Diagnosis not present

## 2017-11-10 DIAGNOSIS — J9601 Acute respiratory failure with hypoxia: Secondary | ICD-10-CM | POA: Diagnosis not present

## 2017-11-10 DIAGNOSIS — J9690 Respiratory failure, unspecified, unspecified whether with hypoxia or hypercapnia: Secondary | ICD-10-CM | POA: Diagnosis not present

## 2017-11-10 DIAGNOSIS — Z4682 Encounter for fitting and adjustment of non-vascular catheter: Secondary | ICD-10-CM | POA: Diagnosis not present

## 2017-11-10 DIAGNOSIS — F17290 Nicotine dependence, other tobacco product, uncomplicated: Secondary | ICD-10-CM | POA: Diagnosis present

## 2017-11-10 DIAGNOSIS — F1990 Other psychoactive substance use, unspecified, uncomplicated: Secondary | ICD-10-CM | POA: Diagnosis not present

## 2017-11-10 DIAGNOSIS — Z789 Other specified health status: Secondary | ICD-10-CM | POA: Diagnosis not present

## 2017-11-10 DIAGNOSIS — J984 Other disorders of lung: Secondary | ICD-10-CM | POA: Diagnosis not present

## 2017-11-10 DIAGNOSIS — R918 Other nonspecific abnormal finding of lung field: Secondary | ICD-10-CM | POA: Diagnosis not present

## 2017-11-10 DIAGNOSIS — R509 Fever, unspecified: Secondary | ICD-10-CM | POA: Diagnosis not present

## 2017-11-10 DIAGNOSIS — Z23 Encounter for immunization: Secondary | ICD-10-CM | POA: Diagnosis not present

## 2017-11-10 DIAGNOSIS — L80 Vitiligo: Secondary | ICD-10-CM | POA: Diagnosis not present

## 2017-11-10 DIAGNOSIS — R0602 Shortness of breath: Secondary | ICD-10-CM | POA: Diagnosis not present

## 2017-11-10 DIAGNOSIS — J90 Pleural effusion, not elsewhere classified: Secondary | ICD-10-CM | POA: Diagnosis not present

## 2017-11-10 DIAGNOSIS — Z79891 Long term (current) use of opiate analgesic: Secondary | ICD-10-CM | POA: Diagnosis not present

## 2017-11-10 DIAGNOSIS — J189 Pneumonia, unspecified organism: Secondary | ICD-10-CM | POA: Diagnosis not present

## 2017-11-10 DIAGNOSIS — F1290 Cannabis use, unspecified, uncomplicated: Secondary | ICD-10-CM | POA: Diagnosis not present

## 2017-11-10 DIAGNOSIS — F1210 Cannabis abuse, uncomplicated: Secondary | ICD-10-CM | POA: Diagnosis present

## 2017-11-10 LAB — RESPIRATORY PANEL BY PCR
Adenovirus: NOT DETECTED
Bordetella pertussis: NOT DETECTED
CHLAMYDOPHILA PNEUMONIAE-RVPPCR: NOT DETECTED
Coronavirus 229E: NOT DETECTED
Coronavirus HKU1: NOT DETECTED
Coronavirus NL63: NOT DETECTED
Coronavirus OC43: NOT DETECTED
INFLUENZA A-RVPPCR: NOT DETECTED
INFLUENZA B-RVPPCR: NOT DETECTED
MYCOPLASMA PNEUMONIAE-RVPPCR: NOT DETECTED
Metapneumovirus: NOT DETECTED
PARAINFLUENZA VIRUS 4-RVPPCR: NOT DETECTED
Parainfluenza Virus 1: NOT DETECTED
Parainfluenza Virus 2: NOT DETECTED
Parainfluenza Virus 3: NOT DETECTED
RESPIRATORY SYNCYTIAL VIRUS-RVPPCR: NOT DETECTED
Rhinovirus / Enterovirus: NOT DETECTED

## 2017-11-10 LAB — CBC WITH DIFFERENTIAL/PLATELET
Abs Immature Granulocytes: 0.1 10*3/uL (ref 0.0–0.1)
Basophils Absolute: 0 10*3/uL (ref 0.0–0.1)
Basophils Relative: 0 %
EOS ABS: 0.1 10*3/uL (ref 0.0–0.7)
EOS PCT: 0 %
HCT: 35.6 % — ABNORMAL LOW (ref 39.0–52.0)
Hemoglobin: 12.3 g/dL — ABNORMAL LOW (ref 13.0–17.0)
Immature Granulocytes: 1 %
LYMPHS ABS: 1.3 10*3/uL (ref 0.7–4.0)
Lymphocytes Relative: 8 %
MCH: 33.1 pg (ref 26.0–34.0)
MCHC: 34.6 g/dL (ref 30.0–36.0)
MCV: 95.7 fL (ref 78.0–100.0)
MONO ABS: 0.4 10*3/uL (ref 0.1–1.0)
MONOS PCT: 3 %
Neutro Abs: 13.4 10*3/uL — ABNORMAL HIGH (ref 1.7–7.7)
Neutrophils Relative %: 88 %
Platelets: 276 10*3/uL (ref 150–400)
RBC: 3.72 MIL/uL — ABNORMAL LOW (ref 4.22–5.81)
RDW: 12.7 % (ref 11.5–15.5)
WBC: 15.2 10*3/uL — ABNORMAL HIGH (ref 4.0–10.5)

## 2017-11-10 LAB — PROCALCITONIN: Procalcitonin: 0.8 ng/mL

## 2017-11-10 LAB — GLUCOSE, CAPILLARY: GLUCOSE-CAPILLARY: 87 mg/dL (ref 70–99)

## 2017-11-10 LAB — MRSA PCR SCREENING: MRSA by PCR: NEGATIVE

## 2017-11-10 LAB — STREP PNEUMONIAE URINARY ANTIGEN: Strep Pneumo Urinary Antigen: NEGATIVE

## 2017-11-10 LAB — SEDIMENTATION RATE: Sed Rate: 68 mm/hr — ABNORMAL HIGH (ref 0–16)

## 2017-11-10 MED ORDER — ALBUTEROL SULFATE (2.5 MG/3ML) 0.083% IN NEBU
2.5000 mg | INHALATION_SOLUTION | RESPIRATORY_TRACT | Status: DC | PRN
  Administered 2017-11-10: 2.5 mg via RESPIRATORY_TRACT

## 2017-11-10 NOTE — Significant Event (Signed)
Rapid Response Event Note  Overview: Hypoxia  Initial Focused Assessment: Called by RT about patient requiring more oxygen. Per RN/RT, oxygen saturations were ranging from the low-mid 80s to 90s on 4.5LNC. Oxygen was increased using a VM as well saturations did not improve overall. Patient was placed on NRB 15L and saturations improved in the mid-upper 90s. Per RT patient is not any an respiratory distress.  I went to assess the patient as well, when I arrived, patient was sleeping comfortably, I woke him, patient is neuro intact, pleasant, conversant, denied SOB/CP and denied pain. Patient stated he was breathing very comfortably. Skin was warm and dry, oxygen saturations 96% on NRB 15L.   RN paged [facility] MD on call to inform MD.  Interventions: - None- - Wean oxygen as patient tolerates - Encourage IS.  Plan of Care: - Will follow as needed  Event Summary:    at    Call Time 2229 Arrival Time 2234 End Time 2250  Angelo Caroll R

## 2017-11-10 NOTE — Progress Notes (Signed)
Placed pt on NRB while sleeping bc SPO2 dropped  to 87% with good waveform.

## 2017-11-10 NOTE — Progress Notes (Signed)
Pt may have spits with meds per Norton BlizzardPaula B Simpson, NP

## 2017-11-10 NOTE — Consult Note (Signed)
Zachary Gill  OER:[id number] DOB: 03-13-1996 DOA: 11/08/2017 PCP: Patient, No Pcp Per    Reason for Consult / Chief Complaint:  Hypoxia   Consulting MD:  [facility]S  HPI/Brief Narrative   22 year old male with PMH significant for vaping who presented on 8/24 with history of subjective fever and emesis with multiple episodes of vomiting and muscle aches; started 8/20 night.  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  No other exposures that knows of.  Denies any recent travel.  No recent sick contacts except his mother who had colitis.  He drinks on the weekends.  He has 2 year hx of smoking cigarettes and now vapes "30-50" hits a day.  He has not changed in what he vapes recently.  Denies IVDA.  He additional smoked marijuana prior to getting sick.    On admit he was febrile at 103.2 and leukocytosis of 15.8.  CT abd/pelvis showed patchy GGO in the lung bases without fibrosis and concern for proximal jejunal intussusception with obstruction.  He was started on ceftriaxone and azithromycin.  He was taken to the OR on 8/24 but had essentially normal findings.  However, his oxygen requirements have continued to worsen and patient is now requiring NRB, otherwise hemodynamically stable.  PCCM consulted for further management.   Subjective  On NRB C/o of mild SOB  Assessment & Plan:  Hypoxia Bilateral pulmonary infiltrates ddx viral vs bacterial pneumonitis vs atypical infection vs fungal infection vs inflammatory response Hx current vaping/ tobacco and marijuana  - improving WBC/ fever curve P:  tx to ICU for further monitoring given increasing O2 requirements High risk for intubation Keep sats > 90% ABG now Assess CBC with differential, PCT, RVP, urine legionella/ strep, ESR, HIV If PCT negative, will consider trial of steriods HRCT chest Clear liquids for now If able to produce sputum send for cx Follow blood cultures, add fungal blood cultures    Will consider bronchoscp  Nausea/ Vomiting - initial concern for intestinal intussusception r/o by exp lap 8/24 - resolved P:  Monitor  Per [facility]  Best Practice / Goals of Care / Disposition.   DVT prophylaxis: lovenox/ SCDs GI prophylaxis:  protonix  Diet: clear liquids Mobility: bedrest/OOB to chair Code Status: full Family Communication: Patient asked privately if it were ok to talk in front of his mother and grandmother at the bedside, and he said yes.  All updated on plan of care. Mother, Colletta [place] [phone]  Disposition / Summary of Today's Plan 11/10/17   Transfer to ICU for closer monitoring.  Remains at high risk of deterioration given O2 requirements, while further testing completed.   Procedures: 8/24 Exploratory Lap  Significant Diagnostic Tests: 8/24 CT abd/pelvis >> 1. There is an approximately 3.3 cm in length region of jejuno jejunal intussusception in the left upper quadrant. Although a specific cause for this is not well seen, outside of childhood most small bowel intussusceptions have a specific lesion or lead point.  There are several scattered air-fluid levels in the proximal small bowel as well, including a borderline dilated loop of jejunum extending to the intussusception. 2. Patchy ground-glass opacities in the lung bases without findings of fibrosis. Possibilities may include atypical pneumonia, acute eosinophilic pneumonia, or acute hypersensitivity reaction. There is no cardiomegaly to suggest a cardiogenic cause. However, there is a trace left pleural effusion.  Micro Data: 8/24 BC x 2>> 8/26 RVP >> 8/26 MRSA PCR >>  Antimicrobials:  8/24 ceftriaxone >> 8/24 azithromycin >>  Objective    General:  Well nourished young adult male sitting in bed in no acute distress HEENT: MM pink/dry, lips cracked, pupils =/round, anicteric  Neuro: Alert, oriented, non-focal CV: 97, rrr, no m/r/g PULM: even/non-labored, lungs bilaterally clear anteriorly,  bibasilar posterior dry crackles, slightly greater on right  GI: soft, BS active, lap incisions/dressing cdi Extremities: warm/dry, no edema  Skin: no rashes, tattoos  Blood pressure (!) 101/56, pulse 68, temperature 98.8 F (37.1 C), temperature source Axillary, resp. rate 18, height _0  (1.702 m), weight 70.3 kg, SpO2 91 %.        Intake/Output Summary (Last 24 hours) at 11/10/2017 1213 Last data filed at 11/10/2017 1100 Gross per 24 hour  Intake 4471.71 ml  Output 850 ml  Net 3621.71 ml   Filed Weights   11/08/17 1342  Weight: 70.3 kg     Labs   CBC: Recent Labs  Lab 11/08/17 1346 11/09/17 0345  WBC 15.8* 12.2*  HGB 13.6 11.2*  HCT 39.1 33.3*  MCV 94.7 97.7  PLT 283 416   Basic Metabolic Panel: Recent Labs  Lab 11/08/17 1346 11/09/17 0345  NA 141 141  K 3.8 4.5  CL 103 107  CO2 23 26  GLUCOSE 126* 183*  BUN 13 12  CREATININE 0.99 0.91  CALCIUM 9.6 8.7*   GFR: Estimated Creatinine Clearance: 122.1 mL/min (by C-G formula based on SCr of 0.91 mg/dL). Recent Labs  Lab 11/08/17 1346 11/08/17 1410 11/08/17 1644 11/09/17 0345  WBC 15.8*  --   --  12.2*  LATICACIDVEN  --  3.02* 1.33  --    Liver Function Tests: Recent Labs  Lab 11/08/17 1346  AST 26  ALT 18  ALKPHOS 118  BILITOT 1.3*  PROT 7.0  ALBUMIN 3.1*   Recent Labs  Lab 11/08/17 1346  LIPASE 23   No results for input(s): AMMONIA in the last 168 hours. ABG No results found for: PHART, PCO2ART, PO2ART, HCO3, TCO2, ACIDBASEDEF, O2SAT  Coagulation Profile: No results for input(s): INR, PROTIME in the last 168 hours. Cardiac Enzymes: No results for input(s): CKTOTAL, CKMB, CKMBINDEX, TROPONINI in the last 168 hours. HbA1C: No results found for: HGBA1C CBG: No results for input(s): GLUCAP in the last 168 hours.   Review of Systems:   POSITIVES IN BOLD  Gen: Denies fever, chills, weight change, fatigue, night sweats HEENT: Denies vision changes sinus congestion, rhinorrhea, sore  throat, neck stiffness, dysphagia PULM: Denies shortness of breath, cough, sputum production, hemoptysis, wheezing CV: Denies chest pain, edema, orthopnea GI: Denies abdominal pain, nausea, vomiting, diarrhea, hematochezia, melena, constipation, change in bowel habits GU: Denies dysuria, hematuria Derm: Denies rash or skin change Heme: Denies easy bruising, bleeding Neuro: Denies headache, numbness, weakness, slurred speech, loss of consciousness  Past medical history  History reviewed. No pertinent past medical history. Social History   Social History   Socioeconomic History  . Marital status: Single    Spouse name: Not on file  . Number of children: Not on file  . Years of education: Not on file  . Highest education level: Not on file  Occupational History  . Not on file  Social Needs  . Financial resource strain: Not on file  . Food insecurity:    Worry: Not on file    Inability: Not on file  . Transportation needs:    Medical: Not on file    Non-medical: Not on file  Tobacco Use  .  Smoking status: Never Smoker  . Smokeless tobacco: Never Used  Substance and Sexual Activity  . Alcohol use: Yes    Comment: 14 shots binge drinker  . Drug use: No  . Sexual activity: Yes    Birth control/protection: Condom  Lifestyle  . Physical activity:    Days per week: Not on file    Minutes per session: Not on file  . Stress: Not on file  Relationships  . Social connections:    Talks on phone: Not on file    Gets together: Not on file    Attends religious service: Not on file    Active member of club or organization: Not on file    Attends meetings of clubs or organizations: Not on file    Relationship status: Not on file  . Intimate partner violence:    Fear of current or ex partner: Not on file    Emotionally abused: Not on file    Physically abused: Not on file    Forced sexual activity: Not on file  Other Topics Concern  . Not on file  Social History Narrative  .  Not on file    Family history   History reviewed. No pertinent family history.  Allergies No Known Allergies  Home meds  Prior to Admission medications   Medication Sig Start Date End Date Taking? Authorizing Provider  acetaminophen (TYLENOL) 500 MG tablet Take 1,000 mg by mouth every 6 (six) hours as needed for mild pain, moderate pain, fever or headache.   Yes [provider]  FLUoxetine (PROZAC) 20 MG capsule Take 20 mg by mouth daily. 10/21/17  Yes [provider]  tacrolimus (PROTOPIC) 0.1 % ointment Apply 1 application topically at bedtime. 09/05/17  Yes [provider]  HYDROcodone-acetaminophen (NORCO/VICODIN) 5-325 MG tablet Take 2 tablets by mouth every 6 (six) hours as needed for severe pain. Patient not taking: Reported on 11/08/2017 04/02/17   Jaynee Eagles, PA-C     LOS: 0 days    Kennieth Rad, AGACNP-BC [facility] Pgr: [phone] or if no answer [phone] 11/10/2017, 2:06 PM

## 2017-11-10 NOTE — Progress Notes (Signed)
[facility] Progress Note  2 Days Post-Op  Subjective: CC: SOB Pt reports increased SOB overnight, requiring high flow O2. Currently on 12L. Was pulling IS to the top, now pulling to 1750. Coughing but not productive. Reports he used to smoke cigarettes, currently vapes and uses 30-50x per day.   Denies abdominal pain, n/v. Having bowel function.   Objective: Vital signs in last 24 hours: Temp:  [97.5 F (36.4 C)-98.8 F (37.1 C)] 98.8 F (37.1 C) (08/26 0431) Pulse Rate:  [68-87] 68 (08/26 0431) Resp:  [16-18] 18 (08/26 0431) BP: (101-126)/(49-72) 101/56 (08/26 0431) SpO2:  [88 %-99 %] 95 % (08/26 0809) Last BM Date: 11/07/17  Intake/Output from previous day: 08/25 0701 - 08/26 0700 In: 3399.2 [P.O.:826; I.V.:2480.8; IV Piggyback:92.4] Out: 850 [Urine:850] Intake/Output this shift: Total I/O In: 323.4 [P.O.:240; I.V.:83.4] Out: -   PE: Gen:  Alert, NAD, pleasant Card:  Regular rate and rhythm, pedal pulses 2+ BL, no calf swelling or tenderness Pulm:  On 12L via Melbeta with sat in the 93-95% range, no wheezing or rhonchi bilaterally, mild soft rales in bilateral lung bases Abd: Soft, non-tender, non-distended, bowel sounds present, incisions c/d/i Skin: warm and dry, no rashes  Psych: A&Ox3   Lab Results:  Recent Labs    11/08/17 1346 11/09/17 0345  WBC 15.8* 12.2*  HGB 13.6 11.2*  HCT 39.1 33.3*  PLT 283 232   BMET Recent Labs    11/08/17 1346 11/09/17 0345  NA 141 141  K 3.8 4.5  CL 103 107  CO2 23 26  GLUCOSE 126* 183*  BUN 13 12  CREATININE 0.99 0.91  CALCIUM 9.6 8.7*   PT/INR No results for input(s): LABPROT, INR in the last 72 hours. CMP     Component Value Date/Time   NA 141 11/09/2017 0345   NA 142 10/10/2016 1610   K 4.5 11/09/2017 0345   K 4.0 10/10/2016 1610   CL 107 11/09/2017 0345   CO2 26 11/09/2017 0345   CO2 25 10/10/2016 1610   GLUCOSE 183 (H) 11/09/2017 0345   GLUCOSE 104 10/10/2016 1610   BUN 12 11/09/2017 0345    BUN 14.6 10/10/2016 1610   CREATININE 0.91 11/09/2017 0345   CREATININE 1.0 10/10/2016 1610   CALCIUM 8.7 (L) 11/09/2017 0345   CALCIUM 10.1 10/10/2016 1610   PROT 7.0 11/08/2017 1346   PROT 7.2 10/10/2016 1610   ALBUMIN 3.1 (L) 11/08/2017 1346   ALBUMIN 4.3 10/10/2016 1610   AST 26 11/08/2017 1346   AST 16 10/10/2016 1610   ALT 18 11/08/2017 1346   ALT 40 10/10/2016 1610   ALKPHOS 118 11/08/2017 1346   ALKPHOS 167 (H) 10/10/2016 1610   BILITOT 1.3 (H) 11/08/2017 1346   BILITOT 0.64 10/10/2016 1610   GFRNONAA >60 11/09/2017 0345   GFRAA >60 11/09/2017 0345   Lipase     Component Value Date/Time   LIPASE 23 11/08/2017 1346       Studies/Results: Dg Chest 2 View  Result Date: 11/08/2017 CLINICAL DATA:  Generalized chest pain.  Weakness for 5 days. EXAM: CHEST - 2 VIEW COMPARISON:  None. FINDINGS: The heart, hila, and mediastinum are normal. No pneumothorax. Bilateral patchy pulmonary infiltrates it in a somewhat perihilar distribution. No nodules or masses. No other acute abnormalities. IMPRESSION: 1. Bilateral pulmonary infiltrates, in a somewhat perihilar distribution most consistent with pneumonia. This could represent a typical multifocal pneumonia. In the appropriate clinical setting, atypical pneumonias such as pneumocystis should be considered. Recommend  follow-up to resolution. Electronically Signed   By: Gerome Sam III M.D   On: 11/08/2017 17:07   Ct Abdomen Pelvis W Contrast  Result Date: 11/08/2017 CLINICAL DATA:  Nausea and vomiting for 4 days.  Fever and chills. EXAM: CT ABDOMEN AND PELVIS WITH CONTRAST TECHNIQUE: Multidetector CT imaging of the abdomen and pelvis was performed using the standard protocol following bolus administration of intravenous contrast. CONTRAST:  ISOVUE-300 IOPAMIDOL (ISOVUE-300) INJECTION 61% COMPARISON:  None. FINDINGS: Lower chest: Patchy ground-glass opacities scattered in both lung bases, indistinctly marginated. Trace left  pleural effusion. Hepatobiliary: Unremarkable Pancreas: Unremarkable Spleen: The spleen measures 10.0 by 5.0 by 13.1 cm (volume = 340 cm^3). No focal splenic abnormality. Adrenals/Urinary Tract: There is contrast medium in the collecting systems on the initial images, which lower sensitivity in detecting nonobstructive renal calculi. No abnormal renal parenchymal enhancement. Adrenal glands normal. No hydronephrosis or hydroureter. Urinary bladder unremarkable. Stomach/Bowel: Prior appendectomy. There is some scattered air-levels in proximal loops small bowel. A jejuno jejunal intussusception measures about 3.3 cm in length on image 77/7. A specific cause for the intussusception is not well seen. Vascular/Lymphatic: Unremarkable Reproductive: Unremarkable Other: No supplemental non-categorized findings. Musculoskeletal: Unremarkable IMPRESSION: 1. There is an approximately 3.3 cm in length region of jejuno jejunal intussusception in the left upper quadrant. Although a specific cause for this is not well seen, outside of childhood most small bowel intussusceptions have a specific lesion or lead point. There are several scattered air-fluid levels in the proximal small bowel as well, including a borderline dilated loop of jejunum extending to the intussusception. 2. Patchy ground-glass opacities in the lung bases without findings of fibrosis. Possibilities may include atypical pneumonia, acute eosinophilic pneumonia, or acute hypersensitivity reaction. There is no cardiomegaly to suggest a cardiogenic cause. However, there is a trace left pleural effusion. Electronically Signed   By: Gaylyn Rong M.D.   On: 11/08/2017 16:11    Anti-infectives: Anti-infectives (From admission, onward)   Start     Dose/Rate Route Frequency Ordered Stop   11/10/17 1000  azithromycin (ZITHROMAX) tablet 250 mg     250 mg Oral Daily 11/09/17 0039     11/09/17 1700  cefTRIAXone (ROCEPHIN) 2 g in sodium chloride 0.9 % 100 mL IVPB      2 g 200 mL/hr over 30 Minutes Intravenous Every 24 hours 11/08/17 1718     11/09/17 0100  azithromycin (ZITHROMAX) tablet 1,000 mg     1,000 mg Oral  Once 11/09/17 0046 11/09/17 0138   11/09/17 0045  azithromycin (ZITHROMAX) powder 1 g  Status:  Discontinued     1 g Oral  Once 11/09/17 0039 11/09/17 0046   11/08/17 1730  cefTRIAXone (ROCEPHIN) 2 g in sodium chloride 0.9 % 100 mL IVPB     2 g 200 mL/hr over 30 Minutes Intravenous  Once 11/08/17 1717 11/08/17 1821   11/08/17 1630  azithromycin (ZITHROMAX) 500 mg in sodium chloride 0.9 % 250 mL IVPB  Status:  Discontinued     500 mg 250 mL/hr over 60 Minutes Intravenous  Once 11/08/17 1616 11/09/17 1014       Assessment/Plan S/p diagnostic laparoscopy, negative for intussusception 8/24 Dr. Lindie Spruce - POD#2 - doing well from a surgical standpoint - tolerating CLD and having bowel function - will advance to soft today   Pneumonia, likely community acquired - CXR 8/24 showed bilateral pulmonary infiltrates with perihilar distribution - repeat CXR on preliminary read appears worsened - WBC 12.2 from 15.8, Pt afebrile -  requiring high flow O2 with sats low-mid 90s - will contact pulmonology and see if they will assume care, as pneumonia is Pt's biggest problem at this point  FEN: soft diet VTE: SCDs, lovenox ID: IV azithromycin 8/24>8/25; IV rocephin 8/24>>, PO azithromycin 8/26>>  LOS: 0 days    Wells Guiles , [facility] 11/10/2017, 9:00 AM Pager: [phone] Consults: [phone] Mon-Fri 7:00 am-4:30 pm Sat-Sun 7:00 am-11:30 am

## 2017-11-10 NOTE — Anesthesia Postprocedure Evaluation (Signed)
Anesthesia Post Note  Patient: Zachary BoundClayton Gill  Procedure(s) Performed: LAPAROSCOPY DIAGNOSTIC (N/A Abdomen)     Patient location during evaluation: PACU Anesthesia Type: General Level of consciousness: awake and alert Pain management: pain level controlled Vital Signs Assessment: post-procedure vital signs reviewed and stable Respiratory status: spontaneous breathing, nonlabored ventilation, respiratory function stable and patient connected to nasal cannula oxygen Cardiovascular status: blood pressure returned to baseline and stable Postop Assessment: no apparent nausea or vomiting Anesthetic complications: no    Last Vitals:  Vitals:   11/10/17 0533 11/10/17 0809  BP:    Pulse:    Resp:    Temp:    SpO2: 95% 95%    Last Pain:  Vitals:   11/10/17 0800  TempSrc:   PainSc: 0-No pain                 [name] S

## 2017-11-11 ENCOUNTER — Inpatient Hospital Stay (HOSPITAL_COMMUNITY): Payer: BLUE CROSS/BLUE SHIELD

## 2017-11-11 ENCOUNTER — Other Ambulatory Visit: Payer: Self-pay

## 2017-11-11 ENCOUNTER — Encounter (HOSPITAL_COMMUNITY): Payer: Self-pay | Admitting: General Surgery

## 2017-11-11 DIAGNOSIS — Z789 Other specified health status: Secondary | ICD-10-CM

## 2017-11-11 DIAGNOSIS — L80 Vitiligo: Secondary | ICD-10-CM

## 2017-11-11 DIAGNOSIS — J189 Pneumonia, unspecified organism: Principal | ICD-10-CM

## 2017-11-11 LAB — COMPREHENSIVE METABOLIC PANEL
ALBUMIN: 2.1 g/dL — AB (ref 3.5–5.0)
ALT: 17 U/L (ref 0–44)
AST: 26 U/L (ref 15–41)
Alkaline Phosphatase: 82 U/L (ref 38–126)
Anion gap: 8 (ref 5–15)
BUN: 7 mg/dL (ref 6–20)
CHLORIDE: 105 mmol/L (ref 98–111)
CO2: 27 mmol/L (ref 22–32)
CREATININE: 0.78 mg/dL (ref 0.61–1.24)
Calcium: 8.4 mg/dL — ABNORMAL LOW (ref 8.9–10.3)
GFR calc Af Amer: >60 mL/min (ref >60)
GLUCOSE: 108 mg/dL — AB (ref 70–99)
POTASSIUM: 4.1 mmol/L (ref 3.5–5.1)
SODIUM: 140 mmol/L (ref 135–145)
Total Bilirubin: 0.6 mg/dL (ref 0.3–1.2)
Total Protein: 5.3 g/dL — ABNORMAL LOW (ref 6.5–8.1)

## 2017-11-11 LAB — LACTIC ACID, PLASMA
Lactic Acid, Venous: 1.2 mmol/L (ref 0.5–1.9)
Lactic Acid, Venous: 1.4 mmol/L (ref 0.5–1.9)

## 2017-11-11 LAB — CBC
HCT: 34.2 % — ABNORMAL LOW (ref 39.0–52.0)
Hemoglobin: 11.7 g/dL — ABNORMAL LOW (ref 13.0–17.0)
MCH: 32.7 pg (ref 26.0–34.0)
MCHC: 34.2 g/dL (ref 30.0–36.0)
MCV: 95.5 fL (ref 78.0–100.0)
PLATELETS: 266 10*3/uL (ref 150–400)
RBC: 3.58 MIL/uL — AB (ref 4.22–5.81)
RDW: 12.4 % (ref 11.5–15.5)
WBC: 14.4 10*3/uL — AB (ref 4.0–10.5)

## 2017-11-11 LAB — EXPECTORATED SPUTUM ASSESSMENT W GRAM STAIN, RFLX TO RESP C

## 2017-11-11 LAB — HIV ANTIBODY (ROUTINE TESTING W REFLEX): HIV SCREEN 4TH GENERATION: NONREACTIVE

## 2017-11-11 LAB — PROCALCITONIN: Procalcitonin: 0.66 ng/mL

## 2017-11-11 LAB — EXPECTORATED SPUTUM ASSESSMENT W REFEX TO RESP CULTURE

## 2017-11-11 LAB — GLUCOSE, CAPILLARY: Glucose-Capillary: 155 mg/dL — ABNORMAL HIGH (ref 70–99)

## 2017-11-11 MED ORDER — CHLORHEXIDINE GLUCONATE 0.12 % MT SOLN
15.0000 mL | Freq: Two times a day (BID) | OROMUCOSAL | Status: DC
  Filled 2017-11-11: qty 15

## 2017-11-11 MED ORDER — FUROSEMIDE 10 MG/ML IJ SOLN
40.0000 mg | Freq: Once | INTRAMUSCULAR | Status: AC
  Administered 2017-11-11: 40 mg via INTRAVENOUS
  Filled 2017-11-11: qty 4

## 2017-11-11 MED ORDER — GABAPENTIN 250 MG/5ML PO SOLN
300.0000 mg | Freq: Two times a day (BID) | ORAL | Status: DC
  Administered 2017-11-11: 300 mg
  Filled 2017-11-11: qty 6

## 2017-11-11 MED ORDER — FLUOXETINE HCL 20 MG/5ML PO SOLN
20.0000 mg | Freq: Every day | ORAL | Status: DC
  Administered 2017-11-11: 20 mg
  Filled 2017-11-11 (×2): qty 5

## 2017-11-11 MED ORDER — SALINE SPRAY 0.65 % NA SOLN
1.0000 | NASAL | Status: DC | PRN
  Administered 2017-11-11: 1 via NASAL
  Filled 2017-11-11: qty 44

## 2017-11-11 MED ORDER — ORAL CARE MOUTH RINSE: 15.0000 mL | Freq: Two times a day (BID) | OROMUCOSAL | Status: DC

## 2017-11-11 MED ORDER — METHYLPREDNISOLONE SODIUM SUCC 40 MG IJ SOLR
40.0000 mg | INTRAMUSCULAR | Status: DC
  Administered 2017-11-11 – 2017-11-12 (×2): 40 mg via INTRAVENOUS
  Filled 2017-11-11 (×2): qty 1

## 2017-11-11 MED ORDER — DEXTROSE 5 % IV SOLN
250.0000 mg | INTRAVENOUS | Status: DC
  Administered 2017-11-11 – 2017-11-12 (×2): 250 mg via INTRAVENOUS
  Filled 2017-11-11 (×3): qty 250

## 2017-11-11 NOTE — Progress Notes (Signed)
eLink Physician-Brief Progress Note Patient Name: Zachary BoundClayton Gill DOB: 1995-12-22 MRN: [id number]   Date of Service  11/11/2017  HPI/Events of Note  N/V - No relief with Zofran. Patient s/p ex lap yesterday. Currently NPO.   eICU Interventions  Will order: 1. NGT to LIS.      Intervention Category Major Interventions: Other:  Sommer,Steven Dennard Nipugene 11/11/2017, 6:42 AM

## 2017-11-11 NOTE — Progress Notes (Signed)
RT instructed pt and family on the use of flutter vale. Pt able to demonstrate back good technique.  RT also discussed the importance of slow deep breathes using the incentive spirometer.  Pt tolerating well at this time.

## 2017-11-11 NOTE — Progress Notes (Signed)
[facility] Progress Note  3 Days Post-Op  Subjective: CC: NG tube placed this morning and pt wants it out. Slightly overwhelmed and agitated this AM. Reports nausea and emesis early this AM. Last flatus/BM yesterday. 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  Discussed post-op ileus with the patient.   Objective: Vital signs in last 24 hours: Temp:  [98.4 F (36.9 C)-99.7 F (37.6 C)] 98.4 F (36.9 C) (08/27 0730) Pulse Rate:  [76-108] 97 (08/27 0600) Resp:  [18-50] 29 (08/27 0600) BP: (99-139)/(37-82) 135/75 (08/27 0600) SpO2:  [87 %-98 %] 94 % (08/27 0600) Last BM Date: 11/07/17  Intake/Output from previous day: 08/26 0701 - 08/27 0700 In: 2398 [P.O.:720; I.V.:1578; IV Piggyback:100] Out: 950 [Urine:950] Intake/Output this shift: No intake/output data recorded.  PE: Gen:  Alert, NAD, cooperative Card:  Regular rate and rhythm, pedal pulses 2+ BL Pulm:  On NRB Abd: Soft, non-tender, mild distention, hypoactive BS, dressings clean and dry  Skin: warm and dry, no rashes  Psych: A&Ox3   Lab Results:  Recent Labs    11/09/17 0345 11/10/17 1424  WBC 12.2* 15.2*  HGB 11.2* 12.3*  HCT 33.3* 35.6*  PLT 232 276   BMET Recent Labs    11/08/17 1346 11/09/17 0345  NA 141 141  K 3.8 4.5  CL 103 107  CO2 23 26  GLUCOSE 126* 183*  BUN 13 12  CREATININE 0.99 0.91  CALCIUM 9.6 8.7*   PT/INR No results for input(s): LABPROT, INR in the last 72 hours. CMP     Component Value Date/Time   NA 141 11/09/2017 0345   NA 142 10/10/2016 1610   K 4.5 11/09/2017 0345   K 4.0 10/10/2016 1610   CL 107 11/09/2017 0345   CO2 26 11/09/2017 0345   CO2 25 10/10/2016 1610   GLUCOSE 183 (H) 11/09/2017 0345   GLUCOSE 104 10/10/2016 1610   BUN 12 11/09/2017 0345   BUN 14.6 10/10/2016 1610   CREATININE 0.91 11/09/2017 0345   CREATININE 1.0 10/10/2016 1610   CALCIUM 8.7 (L) 11/09/2017 0345   CALCIUM 10.1 10/10/2016 1610   PROT 7.0 11/08/2017 1346   PROT 7.2 10/10/2016 1610    ALBUMIN 3.1 (L) 11/08/2017 1346   ALBUMIN 4.3 10/10/2016 1610   AST 26 11/08/2017 1346   AST 16 10/10/2016 1610   ALT 18 11/08/2017 1346   ALT 40 10/10/2016 1610   ALKPHOS 118 11/08/2017 1346   ALKPHOS 167 (H) 10/10/2016 1610   BILITOT 1.3 (H) 11/08/2017 1346   BILITOT 0.64 10/10/2016 1610   GFRNONAA >60 11/09/2017 0345   GFRAA >60 11/09/2017 0345   Lipase     Component Value Date/Time   LIPASE 23 11/08/2017 1346       Studies/Results: Dg Chest Port 1 View  Result Date: 11/10/2017 CLINICAL DATA:  Shortness of Breath EXAM: PORTABLE CHEST 1 VIEW COMPARISON:  11/08/2017 FINDINGS: Cardiac shadow is stable. Bilateral infiltrative changes are noted slightly progressed when compared with the prior exam. Small right pleural effusion is now seen. No bony abnormality is noted. IMPRESSION: Slight progression in bilateral infiltrates with small right pleural effusion. Electronically Signed   By: Alcide Clever M.D.   On: 11/10/2017 09:05   Dg Abd Portable 1v  Result Date: 11/11/2017 CLINICAL DATA:  NG tube placement. EXAM: PORTABLE ABDOMEN - 1 VIEW COMPARISON:  Chest x-ray 09/04/2017.  CT 11/08/2017. FINDINGS: NG tube noted with tip in the stomach. No gastric distention. Diffuse progressive bilateral pulmonary infiltrates noted. Bilateral pleural  effusions noted on today's exam. Linear density is noted over the left chest. This may represent a skin fold and/or pleural thickening as lung markings are noted distal to this linear density. However to further evaluate this finding and the remainder of the chest, PA lateral chest x-ray suggested. No acute bony abnormality. IMPRESSION: 1.  NG tube noted with tip in the stomach.  No gastric distention. 2. Diffuse progressive bilateral pulmonary infiltrates. New bilateral pleural effusions. Linear density noted over the left mid chest as described above. PA and lateral chest x-ray suggested for further evaluation. Electronically Signed   By: Maisie Fushomas   Register   On: 11/11/2017 07:48    Anti-infectives: Anti-infectives (From admission, onward)   Start     Dose/Rate Route Frequency Ordered Stop   11/10/17 1000  azithromycin (ZITHROMAX) tablet 250 mg     250 mg Oral Daily 11/09/17 0039     11/09/17 1700  cefTRIAXone (ROCEPHIN) 2 g in sodium chloride 0.9 % 100 mL IVPB     2 g 200 mL/hr over 30 Minutes Intravenous Every 24 hours 11/08/17 1718     11/09/17 0100  azithromycin (ZITHROMAX) tablet 1,000 mg     1,000 mg Oral  Once 11/09/17 0046 11/09/17 0138   11/09/17 0045  azithromycin (ZITHROMAX) powder 1 g  Status:  Discontinued     1 g Oral  Once 11/09/17 0039 11/09/17 0046   11/08/17 1730  cefTRIAXone (ROCEPHIN) 2 g in sodium chloride 0.9 % 100 mL IVPB     2 g 200 mL/hr over 30 Minutes Intravenous  Once 11/08/17 1717 11/08/17 1821   11/08/17 1630  azithromycin (ZITHROMAX) 500 mg in sodium chloride 0.9 % 250 mL IVPB  Status:  Discontinued     500 mg 250 mL/hr over 60 Minutes Intravenous  Once 11/08/17 1616 11/09/17 1014     Assessment/Plan S/p diagnostic laparoscopy, negative for intussusception 8/24 Dr. Lindie SpruceWyatt - POD#3 - developed nausea and vomiting overnight, CCM MD ordered NGT placement - had bowel function early post-op but no BM/flatus since yesterday, continue NG tube to LIWS and await further bowel function, suspect ileus. DG abd shows a lot of gas in the colon.  - NPO, IVF - OOB/mobilize is hypoxia improves   Acute hypoxic respiratory insufficiency 2/2 atypical pneumonia - appreciate CCM management  FEN: NPO, IVF, NG tube to LIWS  VTE: SCDs, lovenox ID: IV azithromycin 8/24>8/25; IV rocephin 8/24>>, PO azithromycin 8/26-27    LOS: 1 day    Hosie SpangleElizabeth Brinley Rosete, [facility] Pager: [phone]

## 2017-11-11 NOTE — Progress Notes (Signed)
PCCM:  Seen patient at bedside per RT request. Able to wean FiO2 to [facility] to maintain sats 92-93%.   He is tachapenia while sleeping but his oxygenation is greatly improved.   Discussed the possibility of intubation if needed. They are understanding.   Josephine IgoBradley L Prisca Gearing, DO [facility] 11/11/2017 6:44 PM  Personal pager: [phone] If unanswered, please page CCM On-call: #[phone]

## 2017-11-11 NOTE — Progress Notes (Signed)
IPAP and EPAP adjusted to try to help slow RR, without success.  Patient RR 53, and increased WOB.  After hours CCM paged to notify.

## 2017-11-11 NOTE — Consult Note (Addendum)
Zachary Gill  BXU:[id number] DOB: 10-Mar-1996 DOA: 11/08/2017 PCP: Patient, No Pcp Per    Reason for Consult / Chief Complaint:  Hypoxia   Consulting MD:  CCS  HPI/Brief Narrative   22 year old male with PMH significant for vaping who presented on 8/24 with history of subjective fever and emesis with multiple episodes of vomiting and muscle aches; started 8/20 night.  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  No other exposures that knows of.  Denies any recent travel.  No recent sick contacts except his mother who had colitis.  He drinks on the weekends.  He has 2 year hx of smoking cigarettes and now vapes "30-50" hits a day.  He has not changed in what he vapes recently.  Denies IVDA.  He additional smoked marijuana prior to getting sick.    On admit he was febrile at 103.2 and leukocytosis of 15.8.  CT abd/pelvis showed patchy GGO in the lung bases without fibrosis and concern for proximal jejunal intussusception with obstruction.  He was started on ceftriaxone and azithromycin.  He was taken to the OR on 8/24 but had essentially normal findings.  However, his oxygen requirements have continued to worsen and patient is now requiring NRB, otherwise hemodynamically stable.  PCCM consulted for further management.   Subjective  On NRB with high oxygen demands C/o of mild SOB, rate upper 30's Progression of pulmonary infiltrates per bases on CXR N&V, NG placed to suction  Assessment & Plan:  Hypoxia Bilateral pulmonary infiltrates ddx viral vs bacterial pneumonitis vs atypical infection vs fungal infection vs inflammatory response Hx current vaping/ tobacco and marijuana 8/27>> CXR with bilateral  progressive pulmonary infiltrates and new bilateral pleural effusions  Per bases, some improvement upper lobes RVP negative ESR>> 68 PCT >> 0.66 Leukocytosis  P:  Titrate oxygen for sats > 90% Remains High risk for intubation ABG prn  Trend  lactate, PCT, WBC Monitor fever Trend CXR daily Check Mag 8/28 Follow Micro  NPO for now as high risk intubation Continue ABX Azithro to IV dosing If able to produce sputum send for cx Consider bronch Add IS several times an hour as able Add Flutter Valve Careful monitoring of mental status for decompensation Therapeutic trial of Solumedrol 40 mg daily  Cardiovascular + 6 L Worsening infiltrates New effusions Plan: Discontinue maintenance fluids Lasix 40 now DC maintenance IVF  Renal Monitor BMET/ UO daily Replete electrolytes daily Maintain renal perfusion/ Avoid nephrotoxic medications  Heme  Leukocytosis CBC daily Monitor for bleeding  Nausea/ Vomiting - initial concern for intestinal intussusception r/o by exp lap 8/24 - resolved - Developed N&V overnight 8/27  KUB shows no gastric distension>> NG placed P:  Monitor  Per CCS  Auto immune Hx of Vitiligo Sed rate 68 Plan: Check ANA, CRP, ANCA,   Best Practice / Goals of Care / Disposition.   DVT prophylaxis: lovenox/ SCDs GI prophylaxis:  protonix  Diet: clear liquids Mobility: bedrest Code Status: full Family Communication: Patient asked privately if it were ok to talk in front of his mother and grandmother at the bedside, and he said yes.  All updated on plan of care. Mother, Colletta [place] [phone] Father updated in full at bedside by myself and Dr. Valeta Harms 8/27  Disposition / Summary of Today's Plan 11/11/17   8/26>>Transfer to ICU for closer monitoring.  Remains at high risk of deterioration given O2 requirements   8/27>> 100% NRB  weaned to 15L HFNC to enable IS. Sat goal 90-92% Will require close monitoring as remains high risk for intubation. NG placed for N&V. KUB negative for illeus. CXR with progressive changes per bases and new bilateral effusions.6L + Will diurese and add Solumedrol 40 Q day.  Procedures: 8/24 Exploratory Lap  Significant Diagnostic Tests: 8/24 CT abd/pelvis >> 1.  There is an approximately 3.3 cm in length region of jejuno jejunal intussusception in the left upper quadrant. Although a specific cause for this is not well seen, outside of childhood most small bowel intussusceptions have a specific lesion or lead point.  There are several scattered air-fluid levels in the proximal small bowel as well, including a borderline dilated loop of jejunum extending to the intussusception. 2. Patchy ground-glass opacities in the lung bases without findings of fibrosis. Possibilities may include atypical pneumonia, acute eosinophilic pneumonia, or acute hypersensitivity reaction. There is no cardiomegaly to suggest a cardiogenic cause. However, there is a trace left pleural effusion.  Micro Data: 8/24 BC x 2>> 8/26 Blood for fungus>> 8/26 RVP >> Negative 8/26 MRSA PCR >> Negative 8/26 Urine Strep>> Negative 8/26 HIV>> Negative 8/26 Urine Legionella >>  Antimicrobials:  8/24 ceftriaxone >> 8/24 azithromycin >>  Objective    General:  Well nourished young adult male sitting in bed on 100% NRB , Saturation 100%, Alert and Appropriate HEENT: MM pink/dry, lips cracked, pupils =/round, anicteric, No LAD Neuro: Alert, Appropriate,  oriented x 3, non-focal CV: S1, S2,  rrr, no m/r/g, NSR per tele PULM: Bilateral chest excursion, RR 37, lungs bilaterally clear anteriorly upper lobes,, bibasilar posterior dry crackles, slightly greater on right  GI: soft, BS diminished,, lap incisions/dressing cdi, Body mass index is 24.28 kg/m. Extremities: warm/dry, no edema, brisk capillary refill  Skin: no rashes, tattoos  Blood pressure 135/75, pulse 96, temperature 98.4 F (36.9 C), temperature source Axillary, resp. rate (!) 30, height _0  (1.702 m), weight 70.3 kg, SpO2 94 %.        Intake/Output Summary (Last 24 hours) at 11/11/2017 0908 Last data filed at 11/11/2017 0800 Gross per 24 hour  Intake 1594.57 ml  Output 1270 ml  Net 324.57 ml   Filed Weights    11/08/17 1342  Weight: 70.3 kg     Labs   CBC: Recent Labs  Lab 11/08/17 1346 11/09/17 0345 11/10/17 1424  WBC 15.8* 12.2* 15.2*  NEUTROABS  --   --  13.4*  HGB 13.6 11.2* 12.3*  HCT 39.1 33.3* 35.6*  MCV 94.7 97.7 95.7  PLT 283 232 833   Basic Metabolic Panel: Recent Labs  Lab 11/08/17 1346 11/09/17 0345  NA 141 141  K 3.8 4.5  CL 103 107  CO2 23 26  GLUCOSE 126* 183*  BUN 13 12  CREATININE 0.99 0.91  CALCIUM 9.6 8.7*   GFR: Estimated Creatinine Clearance: 122.1 mL/min (by C-G formula based on SCr of 0.91 mg/dL). Recent Labs  Lab 11/08/17 1346 11/08/17 1410 11/08/17 1644 11/09/17 0345 11/10/17 1424 11/11/17 0204  PROCALCITON  --   --   --   --  0.80 0.66  WBC 15.8*  --   --  12.2* 15.2*  --   LATICACIDVEN  --  3.02* 1.33  --   --   --    Liver Function Tests: Recent Labs  Lab 11/08/17 1346  AST 26  ALT 18  ALKPHOS 118  BILITOT 1.3*  PROT 7.0  ALBUMIN 3.1*   Recent Labs  Lab 11/08/17 1346  LIPASE 23   No results for input(s): AMMONIA in the last 168 hours. ABG No results found for: PHART, PCO2ART, PO2ART, HCO3, TCO2, ACIDBASEDEF, O2SAT  Coagulation Profile: No results for input(s): INR, PROTIME in the last 168 hours. Cardiac Enzymes: No results for input(s): CKTOTAL, CKMB, CKMBINDEX, TROPONINI in the last 168 hours. HbA1C: No results found for: HGBA1C CBG: Recent Labs  Lab 11/10/17 1512  GLUCAP 87    Home meds  Prior to Admission medications   Medication Sig Start Date End Date Taking? Authorizing Provider  acetaminophen (TYLENOL) 500 MG tablet Take 1,000 mg by mouth every 6 (six) hours as needed for mild pain, moderate pain, fever or headache.   Yes [provider]  FLUoxetine (PROZAC) 20 MG capsule Take 20 mg by mouth daily. 10/21/17  Yes [provider]  tacrolimus (PROTOPIC) 0.1 % ointment Apply 1 application topically at bedtime. 09/05/17  Yes [provider]  HYDROcodone-acetaminophen (NORCO/VICODIN)  5-325 MG tablet Take 2 tablets by mouth every 6 (six) hours as needed for severe pain. Patient not taking: Reported on 11/08/2017 04/02/17   Jaynee Eagles, PA-C     LOS: 1 day    Magdalen Spatz, AGACNP-BC [facility] Pgr: [phone] 11/11/2017, 9:08 AM

## 2017-11-11 NOTE — Progress Notes (Signed)
Pt encouraged to use incentive spirometer at least 10 times per hour,  Family at bedside. Pt able to reach 1000 mL with the incentive.

## 2017-11-12 ENCOUNTER — Inpatient Hospital Stay (HOSPITAL_COMMUNITY): Payer: BLUE CROSS/BLUE SHIELD

## 2017-11-12 DIAGNOSIS — F1990 Other psychoactive substance use, unspecified, uncomplicated: Secondary | ICD-10-CM

## 2017-11-12 DIAGNOSIS — F1290 Cannabis use, unspecified, uncomplicated: Secondary | ICD-10-CM

## 2017-11-12 LAB — BASIC METABOLIC PANEL
ANION GAP: 10 (ref 5–15)
BUN: 11 mg/dL (ref 6–20)
CHLORIDE: 101 mmol/L (ref 98–111)
CO2: 29 mmol/L (ref 22–32)
Calcium: 9.1 mg/dL (ref 8.9–10.3)
Creatinine, Ser: 0.77 mg/dL (ref 0.61–1.24)
GFR calc Af Amer: >60 mL/min (ref >60)
GFR calc non Af Amer: >60 mL/min (ref >60)
Glucose, Bld: 130 mg/dL — ABNORMAL HIGH (ref 70–99)
Potassium: 4 mmol/L (ref 3.5–5.1)
Sodium: 140 mmol/L (ref 135–145)

## 2017-11-12 LAB — CBC
HEMATOCRIT: 34.8 % — AB (ref 39.0–52.0)
HEMOGLOBIN: 11.9 g/dL — AB (ref 13.0–17.0)
MCH: 32.4 pg (ref 26.0–34.0)
MCHC: 34.2 g/dL (ref 30.0–36.0)
MCV: 94.8 fL (ref 78.0–100.0)
Platelets: 284 10*3/uL (ref 150–400)
RBC: 3.67 MIL/uL — ABNORMAL LOW (ref 4.22–5.81)
RDW: 12.4 % (ref 11.5–15.5)
WBC: 17.5 10*3/uL — AB (ref 4.0–10.5)

## 2017-11-12 LAB — MPO/PR-3 (ANCA) ANTIBODIES
ANCA Proteinase 3: <3.5 U/mL (ref 0.0–3.5)
Myeloperoxidase Abs: <9 U/mL (ref 0.0–9.0)

## 2017-11-12 LAB — PROCALCITONIN: Procalcitonin: 0.85 ng/mL

## 2017-11-12 LAB — C-REACTIVE PROTEIN: CRP: 41.7 mg/dL — AB (ref <1.0)

## 2017-11-12 LAB — LEGIONELLA PNEUMOPHILA SEROGP 1 UR AG: L. PNEUMOPHILA SEROGP 1 UR AG: NEGATIVE

## 2017-11-12 LAB — MAGNESIUM: Magnesium: 2.1 mg/dL (ref 1.7–2.4)

## 2017-11-12 LAB — SEDIMENTATION RATE: SED RATE: 100 mm/h — AB (ref 0–16)

## 2017-11-12 MED ORDER — FUROSEMIDE 10 MG/ML IJ SOLN
40.0000 mg | Freq: Once | INTRAMUSCULAR | Status: AC
  Administered 2017-11-12: 40 mg via INTRAVENOUS
  Filled 2017-11-12: qty 4

## 2017-11-12 MED ORDER — SODIUM CHLORIDE 0.9 % IV SOLN
INTRAVENOUS | Status: DC | PRN
  Administered 2017-11-12 (×2): 1000 mL via INTRAVENOUS

## 2017-11-12 MED ORDER — ACETAMINOPHEN 500 MG PO TABS: 1000.0000 mg | ORAL_TABLET | Freq: Four times a day (QID) | ORAL | Status: DC | PRN

## 2017-11-12 MED ORDER — FLUOXETINE HCL 20 MG PO CAPS
20.0000 mg | ORAL_CAPSULE | Freq: Every day | ORAL | Status: DC
  Administered 2017-11-12 – 2017-11-14 (×3): 20 mg via ORAL
  Filled 2017-11-12 (×3): qty 1

## 2017-11-12 NOTE — Care Management Note (Signed)
Case Management Note  Patient Details  Name: Zachary Gill MRN: [id number] Date of Birth: 10-Apr-1995  Subjective/Objective:              Pneumonitis pneumonia      Action/Plan:  22 year old patient from home w parents. Covered on their insurance, states [facility]. Discussed risks of vaping, making better choices for self care. Anticipate DC to home w family. No CM needs identified at this time.    Expected Discharge Date:  11/11/17               Expected Discharge Plan:     In-House Referral:     Discharge planning Services  CM Consult  Post Acute Care Choice:    Choice offered to:     DME Arranged:    DME Agency:     HH Arranged:    HH Agency:     Status of Service:  In process, will continue to follow  If discussed at Long Length of Stay Meetings, dates discussed:    Additional Comments:  Lawerance SabalDebbie Shuaib Corsino, RN 11/12/2017, 2:31 PM

## 2017-11-12 NOTE — Progress Notes (Signed)
Zachary Gill  VOJ:[id number] DOB: [birth year] DOA: 11/08/2017 PCP: Patient, No Pcp Per    Reason for Consult / Chief Complaint:  Hypoxia   Consulting MD:  CCS  HPI/Brief Narrative   22 year old male admitted 8/24 with fever, emesis, myalgias acute hypoxic respiratory failure of unclear etiology.  Working dx bilateral drug-induced pneumonitis secondary to his vaping.   Also with concern for intussusception with negative exploratory lap 8/24.   8/27 increased O2 needs. Steroids initiated.  8/28 respiratory status much improved, down to 5 L nasal cannula.   Subjective  Feeling much better this am.  Down to 5L [place]. Denies SOB at rest.  Some cough, producing a bit of sputum now.   Assessment & Plan:   Acute hypoxic respiratory failure - unclear etiology. Bilateral infiltrates. RVP neg.  Suspect vape related pneumonitis but differential diagnosis includes eosinophilic pneumonia, DAH.  Significantly improved with steroids.  O2 needs improved.  RVP negative ESR>> 68->100 CRP 41.7 (H)  PCT >> 0.66 Tobacco abuse/vaping/marijuana abuse   P:  Continue supplemental O2 and titrate for O2 sats greater than 90% Follow-up chest x-ray Continue IV steroids for now Continue empiric antibiotics D4 abx Sputum culture We will repeat Lasix 15m IV 8/28 Trend WBC, procalcitonin Follow culture data Okay for p.o. diet Out of bed as able Pulmonary hygiene Smoking cessation ANA, ANCA pending  ?  Mild pulmonary edema Pleural effusions Plan: Repeat Lasix 40 mg IV times one 8/28 Strict I&O   Nausea/ Vomiting-resolved.  Initial concern for intestinal intussusception taste on CT findings but with negative exploratory lap 8/24.  Tolerating NG tube out P:  Monitor  Per CCS  Hx of Vitiligo Sed rate 68->100 (on steroids) Plan: Check ANA, CRP, ANCA,   Best Practice / Goals of Care / Disposition.   DVT prophylaxis: lovenox/ SCDs GI prophylaxis:  protonix  Diet: Regular diet Mobility:  OOB Code Status: full Family Communication: Patient, parents updated at length 8/28 at bedside.   Disposition / Summary of Today's Plan 11/12/17   8/26>>Transfer to ICU for closer monitoring.  Remains at high risk of deterioration given O2 requirements   8/27>> 100% NRB weaned to 15L HFNC to enable IS. Sat goal 90-92% Will require close monitoring as remains high risk for intubation. NG placed for N&V. KUB negative for illeus. CXR with progressive changes per bases and new bilateral effusions.6L + Will diurese and add Solumedrol 40 Q day.  8/28 >>down to 5 L nasal cannula.  Much improved subjectively after steroids started 8/27..  Chest x-ray may be slightly improved.  Will transfer to stepdown.  Procedures: 8/24 Exploratory Lap  Significant Diagnostic Tests: 8/24 CT abd/pelvis >> 1. There is an approximately 3.3 cm in length region of jejuno jejunal intussusception in the left upper quadrant. Although a specific cause for this is not well seen, outside of childhood most small bowel intussusceptions have a specific lesion or lead point.  There are several scattered air-fluid levels in the proximal small bowel as well, including a borderline dilated loop of jejunum extending to the intussusception. 2. Patchy ground-glass opacities in the lung bases without findings of fibrosis. Possibilities may include atypical pneumonia, acute eosinophilic pneumonia, or acute hypersensitivity reaction. There is no cardiomegaly to suggest a cardiogenic cause. However, there is a trace left pleural effusion.  Micro Data: 8/24 BC x 2>> 8/26 Blood for fungus>> 8/26 RVP >> Negative 8/26 MRSA PCR >> Negative 8/26 Urine Strep>> Negative 8/26 HIV>> Negative 8/26 Urine Legionella >>  Antimicrobials:  8/24 ceftriaxone >> 8/24 azithromycin >>  Objective    General: Developed well-nourished young male, no acute distress on nasal cannula HEENT: MM pink/moist Neuro: Awake, alert, appropriate CV: s1s2 rrr,  no m/r/g PULM: even/non-labored, lungs bilaterally clear GI: soft, mildly tender, bsx4 active  Extremities: warm/dry, no edema  Skin: no rashes or lesions   Blood pressure (!) 96/39, pulse (!) 57, temperature 98 F (36.7 C), temperature source Oral, resp. rate (!) 24, height 5' 7" (1.702 m), weight 70.3 kg, SpO2 96 %.    FiO2 (%):  [40 %-96 %] 50 %   Intake/Output Summary (Last 24 hours) at 11/12/2017 0907 Last data filed at 11/12/2017 0542 Gross per 24 hour  Intake 864.23 ml  Output 3770 ml  Net -2905.77 ml   Filed Weights   11/08/17 1342  Weight: 70.3 kg     Labs   CBC: Recent Labs  Lab 11/08/17 1346 11/09/17 0345 11/10/17 1424 11/11/17 0950 11/12/17 0258  WBC 15.8* 12.2* 15.2* 14.4* 17.5*  NEUTROABS  --   --  13.4*  --   --   HGB 13.6 11.2* 12.3* 11.7* 11.9*  HCT 39.1 33.3* 35.6* 34.2* 34.8*  MCV 94.7 97.7 95.7 95.5 94.8  PLT 283 232 276 266 973   Basic Metabolic Panel: Recent Labs  Lab 11/08/17 1346 11/09/17 0345 11/11/17 0950 11/12/17 0258  NA 141 141 140 140  K 3.8 4.5 4.1 4.0  CL 103 107 105 101  CO2 _0 GLUCOSE 126* 183* 108* 130*  BUN _1 CREATININE 0.99 0.91 0.78 0.77  CALCIUM 9.6 8.7* 8.4* 9.1  MG  --   --   --  2.1   GFR: Estimated Creatinine Clearance: 138.9 mL/min (by C-G formula based on SCr of 0.77 mg/dL). Recent Labs  Lab 11/08/17 1410 11/08/17 1644 11/09/17 0345 11/10/17 1424 11/11/17 0204 11/11/17 0950 11/11/17 1251 11/12/17 0258  PROCALCITON  --   --   --  0.80 0.66  --   --  0.85  WBC  --   --  12.2* 15.2*  --  14.4*  --  17.5*  LATICACIDVEN 3.02* 1.33  --   --   --  1.2 1.4  --    Liver Function Tests: Recent Labs  Lab 11/08/17 1346 11/11/17 0950  AST 26 26  ALT 18 17  ALKPHOS 118 82  BILITOT 1.3* 0.6  PROT 7.0 5.3*  ALBUMIN 3.1* 2.1*   Recent Labs  Lab 11/08/17 1346  LIPASE 23   No results for input(s): AMMONIA in the last 168 hours. ABG No results found for: PHART, PCO2ART, PO2ART,  HCO3, TCO2, ACIDBASEDEF, O2SAT  Coagulation Profile: No results for input(s): INR, PROTIME in the last 168 hours. Cardiac Enzymes: No results for input(s): CKTOTAL, CKMB, CKMBINDEX, TROPONINI in the last 168 hours. HbA1C: No results found for: HGBA1C CBG: Recent Labs  Lab 11/10/17 1512 11/11/17 2001  GLUCAP 87 155*    Home meds  Prior to Admission medications   Medication Sig Start Date End Date Taking? Authorizing Provider  acetaminophen (TYLENOL) 500 MG tablet Take 1,000 mg by mouth every 6 (six) hours as needed for mild pain, moderate pain, fever or headache.   Yes [provider]  FLUoxetine (PROZAC) 20 MG capsule Take 20 mg by mouth daily. 10/21/17  Yes [provider]  tacrolimus (PROTOPIC) 0.1 % ointment Apply 1 application topically at bedtime. 09/05/17  Yes [provider]  HYDROcodone-acetaminophen (NORCO/VICODIN)  5-325 MG tablet Take 2 tablets by mouth every 6 (six) hours as needed for severe pain. Patient not taking: Reported on 11/08/2017 04/02/17   Jaynee Eagles, PA-C     LOS: 2 days     Nickolas Madrid, NP 11/12/2017  9:07 AM Pager: [phone] or [phone]

## 2017-11-12 NOTE — Discharge Instructions (Signed)

## 2017-11-12 NOTE — Final Consult Note (Signed)
Consultant Final Sign-Off Note    Assessment/Final recommendations  Zachary Gill is a 22 y.o. male followed by me for abdominal pain and possible intussusception. Diagnostic laparoscopy performed on 11/08/17 was negative for intussusception. Post-operatively the patient require an NG tube for nausea and vomiting. His bowel function returned and his diet has been advanced to regular. His abdominal pain is controlled. His hospital course was complicated by acute respiratory failure, managed by CCM.   Wound care (if applicable): keep steri strips over incisions for 7-10 days, until they fall off. Shower normally with soap and water, do not soak in a bath or hot tub for 2 weeks.   Diet at discharge: regular   Activity at discharge: light duty (nothing over 10-15 lbs for 2 weeks). May walk and go up and down stairs.   Follow-up appointment:  [facility] in 2-3 weeks as listed on AVS.   Pending results:  Unresulted Labs (From admission, onward)    Start     Ordered   11/13/17 0500  CBC  Tomorrow morning,   R    Question:  Specimen collection method  Answer:  Lab=Lab collect   11/12/17 0914   11/13/17 0500  Basic metabolic panel  Tomorrow morning,   R    Question:  Specimen collection method  Answer:  Lab=Lab collect   11/12/17 0914   11/12/17 1138  Antinuclear Antibodies, IFA  Once,   R    Question:  Specimen collection method  Answer:  Lab=Lab collect   11/11/17 1139   11/12/17 0500  ANCA Titers  (Anti-Neutrophilic Cystoplasmic Antibody Panel (PNL))  Tomorrow morning,   R    Question:  Specimen collection method  Answer:  Lab=Lab collect   11/11/17 1139   11/11/17 1346  Hypersensitivity Pneumonitis  Once,   R    Question:  Specimen collection method  Answer:  Lab=Lab collect   11/11/17 1346   11/11/17 1138  Mpo/pr-3 (anca) antibodies  (Anti-Neutrophilic Cystoplasmic Antibody Panel (PNL))  Once,   R    Question:  Specimen collection method  Answer:  Lab=Lab collect   11/11/17 1139   11/11/17 1136  Expectorated sputum assessment w rflx to resp cult  Once,   R    Question:  Patient immune status  Answer:  Normal   11/11/17 1139   11/10/17 1334  Legionella Pneumophila Serogp 1 Ur Ag  Once,   R     11/10/17 1334           Medication recommendations:   Other recommendations:    Thank you for allowing [place] to participate in the care of your patient!  Please consult [place] again if you have further needs for your patient.  Francine Graven Macauley Mossberg 11/12/2017 3:25 PM    Subjective   Patients abdominal pain improving. Tolerating PO without nausea or vomiting. Having flatus and bowel movements.    Objective  Vital signs in last 24 hours: Temp:  [97.8 F (36.6 C)-99.1 F (37.3 C)] 98.1 F (36.7 C) (08/28 1113) Pulse Rate:  [51-104] 92 (08/28 1300) Resp:  [7-53] 32 (08/28 1300) BP: (88-132)/(39-74) 121/68 (08/28 1300) SpO2:  [89 %-99 %] 97 % (08/28 1300) FiO2 (%):  [40 %-50 %] 50 % (08/27 1730)  General: alert, cooperative Abd: soft, mild tenderness around incisions, +BS, incisions clean and dry with incisions and Tegaderm in place.  Pertinent labs and Studies: Recent Labs    11/10/17 1424 11/11/17 0950 11/12/17 0258  WBC 15.2* 14.4* 17.5*  HGB 12.3*  11.7* 11.9*  HCT 35.6* 34.2* 34.8*   BMET Recent Labs    11/11/17 0950 11/12/17 0258  NA 140 140  K 4.1 4.0  CL 105 101  CO2 27 29  GLUCOSE 108* 130*  BUN 7 11  CREATININE 0.78 0.77  CALCIUM 8.4* 9.1   No results for input(s): LABURIN in the last 72 hours. Results for orders placed or performed during the hospital encounter of 11/08/17  Blood culture (routine x 2)     Status: None (Preliminary result)   Collection Time: 11/08/17  2:30 PM  Result Value Ref Range Status   Specimen Description BLOOD RIGHT ANTECUBITAL  Final   Special Requests   Final    BOTTLES DRAWN AEROBIC AND ANAEROBIC Blood Culture adequate volume   Culture   Final    NO GROWTH 4 DAYS Performed at [facility], [address] [place], [place] [address]    Report Status PENDING  Incomplete  Blood culture (routine x 2)     Status: None (Preliminary result)   Collection Time: 11/08/17  3:23 PM  Result Value Ref Range Status   Specimen Description BLOOD LEFT ANTECUBITAL  Final   Special Requests   Final    BOTTLES DRAWN AEROBIC AND ANAEROBIC Blood Culture adequate volume   Culture   Final    NO GROWTH 4 DAYS Performed at [facility], [address] [place], [place] [address]    Report Status PENDING  Incomplete  Respiratory Panel by PCR     Status: None   Collection Time: 11/10/17  1:34 PM  Result Value Ref Range Status   Adenovirus NOT DETECTED NOT DETECTED Final   Coronavirus 229E NOT DETECTED NOT DETECTED Final   Coronavirus HKU1 NOT DETECTED NOT DETECTED Final   Coronavirus NL63 NOT DETECTED NOT DETECTED Final   Coronavirus OC43 NOT DETECTED NOT DETECTED Final   Metapneumovirus NOT DETECTED NOT DETECTED Final   Rhinovirus / Enterovirus NOT DETECTED NOT DETECTED Final   Influenza A NOT DETECTED NOT DETECTED Final   Influenza B NOT DETECTED NOT DETECTED Final   Parainfluenza Virus 1 NOT DETECTED NOT DETECTED Final   Parainfluenza Virus 2 NOT DETECTED NOT DETECTED Final   Parainfluenza Virus 3 NOT DETECTED NOT DETECTED Final   Parainfluenza Virus 4 NOT DETECTED NOT DETECTED Final   Respiratory Syncytial Virus NOT DETECTED NOT DETECTED Final   Bordetella pertussis NOT DETECTED NOT DETECTED Final   Chlamydophila pneumoniae NOT DETECTED NOT DETECTED Final   Mycoplasma pneumoniae NOT DETECTED NOT DETECTED Final    Comment: Performed at [facility], [address] [place], [place] [address]  MRSA PCR Screening     Status: None   Collection Time: 11/10/17  2:14 PM  Result Value Ref Range Status   MRSA by PCR NEGATIVE NEGATIVE Final    Comment:        The GeneXpert MRSA Assay (FDA approved for NASAL specimens only), is one component of a comprehensive MRSA colonization surveillance  program. It is not intended to diagnose MRSA infection nor to guide or monitor treatment for MRSA infections. Performed at [facility], [address] [place], [place] [address]   Fungus culture, blood     Status: None (Preliminary result)   Collection Time: 11/10/17  2:25 PM  Result Value Ref Range Status   Specimen Description BLOOD RIGHT ANTECUBITAL  Final   Special Requests   Final    BOTTLES DRAWN AEROBIC AND ANAEROBIC  Blood Culture adequate volume   Culture   Final    NO GROWTH 2 DAYS Performed at [facility], [address], [place], [place] [address]    Report Status PENDING  Incomplete  Expectorated sputum assessment w rflx to resp cult     Status: None   Collection Time: 11/11/17  4:18 PM  Result Value Ref Range Status   Specimen Description EXPECTORATED SPUTUM  Final   Special Requests NONE  Final   Sputum evaluation   Final    THIS SPECIMEN IS ACCEPTABLE FOR SPUTUM CULTURE Performed at [facility], [address] [place], [place] [address]    Report Status 11/11/2017 FINAL  Final  Culture, respiratory     Status: None (Preliminary result)   Collection Time: 11/11/17  4:18 PM  Result Value Ref Range Status   Specimen Description EXPECTORATED SPUTUM  Final   Special Requests NONE Reflexed from [id number]  Final   Gram Stain   Final    RARE WBC PRESENT, PREDOMINANTLY PMN NO ORGANISMS SEEN    Culture   Final    CULTURE REINCUBATED FOR BETTER GROWTH Performed at [facility], [address], [place], [place] [address]    Report Status PENDING  Incomplete    Imaging: Dg Chest Port 1 View  Result Date: 11/12/2017 CLINICAL DATA:  Respiratory failure. EXAM: PORTABLE CHEST 1 VIEW COMPARISON:  11/11/2017 FINDINGS: The cardiomediastinal silhouette is unchanged with normal heart size. Enteric tube has been removed. Widespread airspace opacities are again seen throughout both lungs, densest/most confluent in the left lower lung. Right lung  aeration appears slightly improved. There is a small right pleural effusion. No pneumothorax is identified. IMPRESSION: 1. Interval removal of enteric tube. 2. Widespread bilateral airspace disease, slightly improved on the right. Electronically Signed   By: Sebastian Ache M.D.   On: 11/12/2017 07:36    Hosie Spangle, [facility] Pager: [phone]

## 2017-11-12 NOTE — Progress Notes (Signed)
[facility] [place] [facility] Progress Note  4 Days Post-Op  Subjective: CC: Feels much better, patient and mom report that his breathing significantly improved yesterday evening. Tolerating clears without nausea or vomiting. Multiple BMs and flatus.   Objective: Vital signs in last 24 hours: Temp:  [97.8 F (36.6 C)-99.1 F (37.3 C)] 97.8 F (36.6 C) (08/28 0403) Pulse Rate:  [51-107] 57 (08/28 0600) Resp:  [7-53] 24 (08/28 0600) BP: (96-130)/(39-74) 96/39 (08/28 0600) SpO2:  [89 %-100 %] 96 % (08/28 0600) FiO2 (%):  [40 %-96 %] 50 % (08/27 1730) Last BM Date: 11/10/17  Intake/Output from previous day: 08/27 0701 - 08/28 0700 In: 1064.3 [P.O.:510; I.V.:298.1; IV Piggyback:256.2] Out: 4090 [Urine:4090] Intake/Output this shift: No intake/output data recorded.  PE: Gen:  Alert, NAD, pleasant and cooperative - looks well this morning. Card:  Regular rate and rhythm, pedal pulses 2+ BL  Pulm:  Normal effort on 5L [place], clear to auscultation bilaterally Abd: Soft, non-tender, non-distended, bowel sounds present in all 4 quadrants, no organomegaly, incisions C/D/I w/ Tegaderm and steris in place  Skin: warm and dry, no rashes  Psych: A&Ox3   Lab Results:  Recent Labs    11/11/17 0950 11/12/17 0258  WBC 14.4* 17.5*  HGB 11.7* 11.9*  HCT 34.2* 34.8*  PLT 266 284   BMET Recent Labs    11/11/17 0950 11/12/17 0258  NA 140 140  K 4.1 4.0  CL 105 101  CO2 27 29  GLUCOSE 108* 130*  BUN 7 11  CREATININE 0.78 0.77  CALCIUM 8.4* 9.1   PT/INR No results for input(s): LABPROT, INR in the last 72 hours. CMP     Component Value Date/Time   NA 140 11/12/2017 0258   NA 142 10/10/2016 1610   K 4.0 11/12/2017 0258   K 4.0 10/10/2016 1610   CL 101 11/12/2017 0258   CO2 29 11/12/2017 0258   CO2 25 10/10/2016 1610   GLUCOSE 130 (H) 11/12/2017 0258   GLUCOSE 104 10/10/2016 1610   BUN 11 11/12/2017 0258   BUN 14.6 10/10/2016 1610   CREATININE 0.77 11/12/2017 0258   CREATININE  1.0 10/10/2016 1610   CALCIUM 9.1 11/12/2017 0258   CALCIUM 10.1 10/10/2016 1610   PROT 5.3 (L) 11/11/2017 0950   PROT 7.2 10/10/2016 1610   ALBUMIN 2.1 (L) 11/11/2017 0950   ALBUMIN 4.3 10/10/2016 1610   AST 26 11/11/2017 0950   AST 16 10/10/2016 1610   ALT 17 11/11/2017 0950   ALT 40 10/10/2016 1610   ALKPHOS 82 11/11/2017 0950   ALKPHOS 167 (H) 10/10/2016 1610   BILITOT 0.6 11/11/2017 0950   BILITOT 0.64 10/10/2016 1610   GFRNONAA >60 11/12/2017 0258   GFRAA >60 11/12/2017 0258   Lipase     Component Value Date/Time   LIPASE 23 11/08/2017 1346       Studies/Results: Ct Chest High Resolution  Result Date: 11/11/2017 CLINICAL DATA:  Inpatient. Hypoxemia. Patient on non-rebreather mask. EXAM: CT CHEST WITHOUT CONTRAST TECHNIQUE: Multidetector CT imaging of the chest was performed following the standard protocol without intravenous contrast. High resolution imaging of the lungs, as well as inspiratory and expiratory imaging, was performed. COMPARISON:  Chest radiograph from earlier today. FINDINGS: Cardiovascular: Normal heart size. No significant pericardial effusion/thickening. Great vessels are normal in course and caliber. Mediastinum/Nodes: No discrete thyroid nodules. Unremarkable esophagus. No axillary adenopathy. Mildly enlarged 1.1 cm right paratracheal node (series 5/image 45). Mildly enlarged 1.2 cm subcarinal node (series 5/image 74). No additional  pathologically enlarged mediastinal or discrete hilar nodes on this noncontrast scan. Lungs/Pleura: No pneumothorax. Small dependent bilateral pleural effusions. Extensive patchy peribronchovascular consolidation and ground-glass opacity throughout both lungs involving all lung lobes, most prominent in the lower lobes, with sparing of the immediate subpleural lungs. No discrete lung masses or significant pulmonary nodules. No significant bronchiectasis, architectural distortion or frank honeycombing. No significant air trapping on  the expiration sequence. Upper abdomen: No acute abnormality. Musculoskeletal:  No aggressive appearing focal osseous lesions. IMPRESSION: 1. Extensive patchy peribronchovascular consolidation and ground-glass opacity throughout both lungs, most prominent in the lower lobes. Differential is broad and includes acute interstitial pneumonia/ARDS, diffuse alveolar hemorrhage (such as from vasculitis), multilobar infection or other causes of noncardiogenic pulmonary edema (drug toxicity, toxic inhalation, near drowning). 2. Mild mediastinal adenopathy, nonspecific, potentially reactive. 3. Small dependent bilateral pleural effusions. Electronically Signed   By: Ilona Sorrel M.D.   On: 11/11/2017 09:59   Dg Chest Port 1 View  Result Date: 11/12/2017 CLINICAL DATA:  Respiratory failure. EXAM: PORTABLE CHEST 1 VIEW COMPARISON:  11/11/2017 FINDINGS: The cardiomediastinal silhouette is unchanged with normal heart size. Enteric tube has been removed. Widespread airspace opacities are again seen throughout both lungs, densest/most confluent in the left lower lung. Right lung aeration appears slightly improved. There is a small right pleural effusion. No pneumothorax is identified. IMPRESSION: 1. Interval removal of enteric tube. 2. Widespread bilateral airspace disease, slightly improved on the right. Electronically Signed   By: Logan Bores M.D.   On: 11/12/2017 07:36   Dg Chest Port 1 View  Result Date: 11/11/2017 CLINICAL DATA:  22 year old with respiratory failure. EXAM: PORTABLE CHEST 1 VIEW COMPARISON:  11/10/2017 and 11/08/2017 and chest CT 11/10/2017 FINDINGS: Worsening airspace densities in both lungs, particularly at the lung bases. Heart size remains normal for size. Trachea is midline. Nasogastric tube extends into the abdomen. Negative for a pneumothorax. Again noted are pleural-based densities along the lateral right lower chest and probably represent overlying shadows. No evidence for large pleural  effusions. IMPRESSION: Worsening bilateral airspace disease. Electronically Signed   By: Markus Daft M.D.   On: 11/11/2017 10:02   Dg Chest Port 1 View  Result Date: 11/10/2017 CLINICAL DATA:  Shortness of Breath EXAM: PORTABLE CHEST 1 VIEW COMPARISON:  11/08/2017 FINDINGS: Cardiac shadow is stable. Bilateral infiltrative changes are noted slightly progressed when compared with the prior exam. Small right pleural effusion is now seen. No bony abnormality is noted. IMPRESSION: Slight progression in bilateral infiltrates with small right pleural effusion. Electronically Signed   By: Inez Catalina M.D.   On: 11/10/2017 09:05   Dg Abd Portable 1v  Result Date: 11/11/2017 CLINICAL DATA:  NG tube placement. EXAM: PORTABLE ABDOMEN - 1 VIEW COMPARISON:  Chest x-ray 09/04/2017.  CT 11/08/2017. FINDINGS: NG tube noted with tip in the stomach. No gastric distention. Diffuse progressive bilateral pulmonary infiltrates noted. Bilateral pleural effusions noted on today's exam. Linear density is noted over the left chest. This may represent a skin fold and/or pleural thickening as lung markings are noted distal to this linear density. However to further evaluate this finding and the remainder of the chest, PA lateral chest x-ray suggested. No acute bony abnormality. IMPRESSION: 1.  NG tube noted with tip in the stomach.  No gastric distention. 2. Diffuse progressive bilateral pulmonary infiltrates. New bilateral pleural effusions. Linear density noted over the left mid chest as described above. PA and lateral chest x-ray suggested for further evaluation. Electronically Signed   By:  [place]   On: 11/11/2017 07:48    Anti-infectives: Anti-infectives (From admission, onward)   Start     Dose/Rate Route Frequency Ordered Stop   11/11/17 1030  azithromycin (ZITHROMAX) 250 mg in dextrose 5 % 125 mL IVPB     250 mg 125 mL/hr over 60 Minutes Intravenous Every 24 hours 11/11/17 0957     11/10/17 1000  azithromycin  (ZITHROMAX) tablet 250 mg  Status:  Discontinued     250 mg Oral Daily 11/09/17 0039 11/11/17 0957   11/09/17 1700  cefTRIAXone (ROCEPHIN) 2 g in sodium chloride 0.9 % 100 mL IVPB     2 g 200 mL/hr over 30 Minutes Intravenous Every 24 hours 11/08/17 1718     11/09/17 0100  azithromycin (ZITHROMAX) tablet 1,000 mg     1,000 mg Oral  Once 11/09/17 0046 11/09/17 0138   11/09/17 0045  azithromycin (ZITHROMAX) powder 1 g  Status:  Discontinued     1 g Oral  Once 11/09/17 0039 11/09/17 0046   11/08/17 1730  cefTRIAXone (ROCEPHIN) 2 g in sodium chloride 0.9 % 100 mL IVPB     2 g 200 mL/hr over 30 Minutes Intravenous  Once 11/08/17 1717 11/08/17 1821   11/08/17 1630  azithromycin (ZITHROMAX) 500 mg in sodium chloride 0.9 % 250 mL IVPB  Status:  Discontinued     500 mg 250 mL/hr over 60 Minutes Intravenous  Once 11/08/17 1616 11/09/17 1014     Assessment/Plan S/p diagnostic laparoscopy, negative for intussusception 8/24 Dr. Hulen Skains - POD#4 - bowel function returned, having multiple BMs - tolerating clears, advance diet to regular as tolerated.  - OOB/mobilize as hypoxia improves   Acute hypoxic respiratory insufficiency 2/2 atypical pneumonia - appreciate CCM management; on IV solumedrol for suspected pneumonitis, autoimmune workup pending given pts history of vitiligo (CRP 41.7, ESR 100, ANCA pending); CXR looks significantly improved this morning, O2 requirements decreasing.  FEN: SOFT, fluid balance improved s/p lasix   VTE: SCDs, lovenox ID: IV azithromycin 8/24>>; IV rocephin 8/24>>, WBC 17.5 from 14.4, afebrile   Patient is stable for discharge from a general surgery perspective. Will discuss CCM taking over patient care today.    LOS: 2 days    Obie Dredge, [facility] Pager: [phone]

## 2017-11-13 ENCOUNTER — Inpatient Hospital Stay (HOSPITAL_COMMUNITY): Payer: BLUE CROSS/BLUE SHIELD

## 2017-11-13 LAB — BASIC METABOLIC PANEL
Anion gap: 10 (ref 5–15)
BUN: 18 mg/dL (ref 6–20)
CALCIUM: 9 mg/dL (ref 8.9–10.3)
CO2: 29 mmol/L (ref 22–32)
Chloride: 102 mmol/L (ref 98–111)
Creatinine, Ser: 0.74 mg/dL (ref 0.61–1.24)
GFR calc Af Amer: >60 mL/min (ref >60)
GFR calc non Af Amer: >60 mL/min (ref >60)
GLUCOSE: 135 mg/dL — AB (ref 70–99)
Potassium: 4.4 mmol/L (ref 3.5–5.1)
Sodium: 141 mmol/L (ref 135–145)

## 2017-11-13 LAB — ANCA TITERS
Atypical P-ANCA titer: <1:20 {titer}
C-ANCA: <1:20 {titer}
P-ANCA: <1:20 {titer}

## 2017-11-13 LAB — CBC
HCT: 35.8 % — ABNORMAL LOW (ref 39.0–52.0)
Hemoglobin: 12.3 g/dL — ABNORMAL LOW (ref 13.0–17.0)
MCH: 32.5 pg (ref 26.0–34.0)
MCHC: 34.4 g/dL (ref 30.0–36.0)
MCV: 94.7 fL (ref 78.0–100.0)
Platelets: 359 10*3/uL (ref 150–400)
RBC: 3.78 MIL/uL — ABNORMAL LOW (ref 4.22–5.81)
RDW: 12.3 % (ref 11.5–15.5)
WBC: 16.7 10*3/uL — AB (ref 4.0–10.5)

## 2017-11-13 LAB — CULTURE, BLOOD (ROUTINE X 2)
Culture: NO GROWTH
Culture: NO GROWTH
Special Requests: ADEQUATE
Special Requests: ADEQUATE

## 2017-11-13 LAB — ANTINUCLEAR ANTIBODIES, IFA: ANA Ab, IFA: NEGATIVE

## 2017-11-13 MED ORDER — AZITHROMYCIN 500 MG PO TABS
250.0000 mg | ORAL_TABLET | Freq: Every day | ORAL | Status: AC
  Administered 2017-11-13: 250 mg via ORAL
  Filled 2017-11-13: qty 1

## 2017-11-13 MED ORDER — PREDNISONE 20 MG PO TABS
40.0000 mg | ORAL_TABLET | Freq: Every day | ORAL | Status: DC
  Administered 2017-11-13 – 2017-11-14 (×2): 40 mg via ORAL
  Filled 2017-11-13 (×2): qty 2

## 2017-11-13 NOTE — Progress Notes (Signed)
Zachary Gill  ESL:[id number] DOB: 12-25-1995 DOA: 11/08/2017 PCP: Patient, No Pcp Per    Reason for Consult / Chief Complaint:  Hypoxia   Consulting MD:  CCS  HPI/Brief Narrative   22 year old male admitted 8/24 with fever, emesis, myalgias acute hypoxic respiratory failure of unclear etiology.  Working dx bilateral drug-induced pneumonitis secondary to his vaping.   Also with concern for intussusception with negative exploratory lap 8/24.   8/27 increased O2 needs. Steroids initiated.  8/28 respiratory status much improved, down to 5 L nasal cannula. 8/29 continues to improve.  Down to 2L [place]. tx floor.   Subjective  Not feeling quite as good today.  Tired.  "Blah". But breathing continues to improve.  Down to 2L [place].  Ambulated in hall on O2.   Assessment & Plan:   Acute hypoxic respiratory failure - unclear etiology. Suspect vaping or drug related pneumonitis.   Significantly improved with steroids and gentle diuresis.  O2 needs improving.  RVP negative ESR>> 68->100 CRP 41.7 (H)  PCT >> 0.66 ANCA <3.5 (NEG) HIV neg  Tobacco abuse/vaping/marijuana abuse   P:  Continue supplemental O2 and titrate for O2 sats greater than 90% Follow-up chest x-ray in am  Change steroids to PO prednisone 61m daily and taper slowly  Continue empiric abx D5  Sputum culture pending  Hold further lasix for now  Trend wbc, pct  Mobilize  IS, flutter  ANA pending  Smoking cessation discussed at length    ?  Mild pulmonary edema Pleural effusions Plan: Holding further diuresis    Nausea/ Vomiting-resolved.  Initial concern for intestinal intussusception taste on CT findings but with negative exploratory lap 8/24.  Tolerating NG tube out P:  Monitor  CCS signed off - wound care instructions left in their final note for d/c   Hx of Vitiligo Sed rate 68->100 (on steroids) Plan: Check ANA CRP, ANCA neg   Ok for tx to floor  Will ask TRH to assume care in am 8/30  Best  Practice / Goals of Care / Disposition.   DVT prophylaxis: lovenox/ SCDs GI prophylaxis:  protonix  Diet: Regular diet Mobility: OOB Code Status: full Family Communication: Patient, parents updated at length 8/28 at bedside.   Disposition / Summary of Today's Plan 11/13/17   8/26>>Transfer to ICU for closer monitoring.  Remains at high risk of deterioration given O2 requirements   8/27>> 100% NRB weaned to 15L HFNC to enable IS. Sat goal 90-92% Will require close monitoring as remains high risk for intubation. NG placed for N&V. KUB negative for illeus. CXR with progressive changes per bases and new bilateral effusions.6L + Will diurese and add Solumedrol 40 Q day.  8/28 >>down to 5 L nasal cannula.  Much improved subjectively after steroids started 8/27..  Chest x-ray may be slightly improved.  Will transfer to stepdown.  8/2>> down to 2L [place].  Steroids changed to PO.  Tx floor.   Procedures: 8/24 Exploratory Lap  Significant Diagnostic Tests: 8/24 CT abd/pelvis >> 1. There is an approximately 3.3 cm in length region of jejuno jejunal intussusception in the left upper quadrant. Although a specific cause for this is not well seen, outside of childhood most small bowel intussusceptions have a specific lesion or lead point.  There are several scattered air-fluid levels in the proximal small bowel as well, including a borderline dilated loop of jejunum extending to the intussusception. 2. Patchy ground-glass opacities in the lung bases without findings of  fibrosis. Possibilities may include atypical pneumonia, acute eosinophilic pneumonia, or acute hypersensitivity reaction. There is no cardiomegaly to suggest a cardiogenic cause. However, there is a trace left pleural effusion.  Micro Data: 8/24 BC x 2>> 8/26 Blood for fungus>> 8/26 RVP >> Negative 8/26 MRSA PCR >> Negative 8/26 Urine Strep>> Negative 8/26 HIV>> Negative 8/26 Urine Legionella >>  Antimicrobials:  8/24 ceftriaxone  >> 8/24 azithromycin >>  Objective    General: Developed well-nourished young male, no acute distress on nasal cannula HEENT: MM pink/moist Neuro: Awake, alert, appropriate CV: s1s2 rrr, no m/r/g PULM: even/non-labored, lungs bilaterally clear GI: soft, mildly tender, bsx4 active  Extremities: warm/dry, no edema  Skin: no rashes or lesions   Blood pressure 117/67, pulse 65, temperature 99 F (37.2 C), temperature source Oral, resp. rate (!) 23, height 5' 7" (1.702 m), weight 70.3 kg, SpO2 93 %.        Intake/Output Summary (Last 24 hours) at 11/13/2017 0927 Last data filed at 11/13/2017 0800 Gross per 24 hour  Intake 277.04 ml  Output 1700 ml  Net -1422.96 ml   Filed Weights   11/08/17 1342  Weight: 70.3 kg     Labs   CBC: Recent Labs  Lab 11/09/17 0345 11/10/17 1424 11/11/17 0950 11/12/17 0258 11/13/17 0302  WBC 12.2* 15.2* 14.4* 17.5* 16.7*  NEUTROABS  --  13.4*  --   --   --   HGB 11.2* 12.3* 11.7* 11.9* 12.3*  HCT 33.3* 35.6* 34.2* 34.8* 35.8*  MCV 97.7 95.7 95.5 94.8 94.7  PLT 232 276 266 284 279   Basic Metabolic Panel: Recent Labs  Lab 11/08/17 1346 11/09/17 0345 11/11/17 0950 11/12/17 0258 11/13/17 0302  NA 141 141 140 140 141  K 3.8 4.5 4.1 4.0 4.4  CL 103 107 105 101 102  CO2 _0 GLUCOSE 126* 183* 108* 130* 135*  BUN _1 CREATININE 0.99 0.91 0.78 0.77 0.74  CALCIUM 9.6 8.7* 8.4* 9.1 9.0  MG  --   --   --  2.1  --    GFR: Estimated Creatinine Clearance: 138.9 mL/min (by C-G formula based on SCr of 0.74 mg/dL). Recent Labs  Lab 11/08/17 1410 11/08/17 1644  11/10/17 1424 11/11/17 0204 11/11/17 0950 11/11/17 1251 11/12/17 0258 11/13/17 0302  PROCALCITON  --   --   --  0.80 0.66  --   --  0.85  --   WBC  --   --    < > 15.2*  --  14.4*  --  17.5* 16.7*  LATICACIDVEN 3.02* 1.33  --   --   --  1.2 1.4  --   --    < > = values in this interval not displayed.   Liver Function Tests: Recent Labs  Lab  11/08/17 1346 11/11/17 0950  AST 26 26  ALT 18 17  ALKPHOS 118 82  BILITOT 1.3* 0.6  PROT 7.0 5.3*  ALBUMIN 3.1* 2.1*   Recent Labs  Lab 11/08/17 1346  LIPASE 23   No results for input(s): AMMONIA in the last 168 hours. ABG No results found for: PHART, PCO2ART, PO2ART, HCO3, TCO2, ACIDBASEDEF, O2SAT  Coagulation Profile: No results for input(s): INR, PROTIME in the last 168 hours. Cardiac Enzymes: No results for input(s): CKTOTAL, CKMB, CKMBINDEX, TROPONINI in the last 168 hours. HbA1C: No results found for: HGBA1C CBG: Recent Labs  Lab 11/10/17 1512 11/11/17 2001  GLUCAP 87 155*  Home meds  Prior to Admission medications   Medication Sig Start Date End Date Taking? Authorizing Provider  acetaminophen (TYLENOL) 500 MG tablet Take 1,000 mg by mouth every 6 (six) hours as needed for mild pain, moderate pain, fever or headache.   Yes [provider]  FLUoxetine (PROZAC) 20 MG capsule Take 20 mg by mouth daily. 10/21/17  Yes [provider]  tacrolimus (PROTOPIC) 0.1 % ointment Apply 1 application topically at bedtime. 09/05/17  Yes [provider]  HYDROcodone-acetaminophen (NORCO/VICODIN) 5-325 MG tablet Take 2 tablets by mouth every 6 (six) hours as needed for severe pain. Patient not taking: Reported on 11/08/2017 04/02/17   Jaynee Eagles, PA-C     LOS: 3 days     Nickolas Madrid, NP 11/13/2017  9:27 AM Pager: [phone] or [phone]

## 2017-11-14 ENCOUNTER — Inpatient Hospital Stay (HOSPITAL_COMMUNITY): Payer: BLUE CROSS/BLUE SHIELD

## 2017-11-14 ENCOUNTER — Other Ambulatory Visit: Payer: Self-pay | Admitting: Pulmonary Disease

## 2017-11-14 DIAGNOSIS — R509 Fever, unspecified: Secondary | ICD-10-CM

## 2017-11-14 DIAGNOSIS — K561 Intussusception: Secondary | ICD-10-CM

## 2017-11-14 LAB — HYPERSENSITIVITY PNEUMONITIS
A. PULLULANS ABS: NEGATIVE
A.Fumigatus #1 Abs: NEGATIVE
Micropolyspora faeni, IgG: NEGATIVE
Pigeon Serum Abs: NEGATIVE
THERMOACT. SACCHARII: NEGATIVE
Thermoactinomyces vulgaris, IgG: NEGATIVE

## 2017-11-14 LAB — CBC
HCT: 42.5 % (ref 39.0–52.0)
Hemoglobin: 13.8 g/dL (ref 13.0–17.0)
MCH: 31.4 pg (ref 26.0–34.0)
MCHC: 32.5 g/dL (ref 30.0–36.0)
MCV: 96.6 fL (ref 78.0–100.0)
Platelets: 438 10*3/uL — ABNORMAL HIGH (ref 150–400)
RBC: 4.4 MIL/uL (ref 4.22–5.81)
RDW: 12.4 % (ref 11.5–15.5)
WBC: 13 10*3/uL — ABNORMAL HIGH (ref 4.0–10.5)

## 2017-11-14 LAB — CULTURE, RESPIRATORY W GRAM STAIN: Culture: NORMAL

## 2017-11-14 MED ORDER — CEFDINIR 300 MG PO CAPS
600.0000 mg | ORAL_CAPSULE | Freq: Once | ORAL | Status: AC
  Administered 2017-11-14: 600 mg via ORAL
  Filled 2017-11-14 (×2): qty 2

## 2017-11-14 MED ORDER — SODIUM CHLORIDE 0.9 % IV SOLN
1.0000 g | INTRAVENOUS | Status: DC
  Filled 2017-11-14 (×3): qty 10

## 2017-11-14 MED ORDER — PREDNISONE 10 MG PO TABS: ORAL_TABLET | ORAL | 0 refills | Status: DC

## 2017-11-14 NOTE — Progress Notes (Signed)
Pt for discharge going home his family at the bedside, discontinued peripheral IV line, o2sat 95% room air, no complain of pain , health teachings, next appointment, due meds explained and understood, no s/s of resp distress noted.

## 2017-11-14 NOTE — Discharge Summary (Signed)
Physician Discharge Summary  Killian Schwer ZOX:[id number] DOB: Nov 25, 1995 DOA: 11/08/2017  PCP: Patient, No Pcp Per  Admit date: 11/08/2017 Discharge date: 11/14/2017  Admitted From: Home  Disposition: Home   Recommendations for Outpatient Follow-up:  1. Follow up with PCP in 1-2 weeks 2. Please obtain BMP/CBC in one week 3. Needs to follow up with pulmonologist for further care of pneumonitis.     Discharge Condition; stable.  CODE STATUS: full code.  Diet recommendation: Regular  Brief/Interim Summary: 22 year old male admitted 8/24 with fever, emesis, myalgias acute hypoxic respiratory failure of unclear etiology.  Working dx bilateral drug-induced pneumonitis secondary to his vaping.   Also with concern for intussusception with negative exploratory lap 8/24.   1-Acute Hypoxic Respiratory Failure;  Thought to be related to vaping or drug related pneumonitis.  He required high oxygen supplementation. Over days his oxygen requirement has resolved.  Treated with Antibiotics for 7 days. One more dose of Ceftin today.  He will be discharge on prednisone taper.  Advised to stop doing vaping.  Plan to follow with pulmonologist  Rheumatology panel negative.  Stable for discharge.   2-Abdominal pain;  Concern for  Intusuception on admission. Underwent exploratory laparoscopy this admission, which rule out intussusception.  Tolerating diet.  Plan per surgery     Discharge Diagnoses:  Active Problems:   Intussusception [facility])    Discharge Instructions  Discharge Instructions    Diet - low sodium heart healthy   Complete by:  As directed    Increase activity slowly   Complete by:  As directed      Allergies as of 11/14/2017   No Known Allergies     Medication List    STOP taking these medications   HYDROcodone-acetaminophen 5-325 MG tablet Commonly known as:  NORCO/VICODIN     TAKE these medications   acetaminophen 500 MG tablet Commonly known as:   TYLENOL Take 1,000 mg by mouth every 6 (six) hours as needed for mild pain, moderate pain, fever or headache.   FLUoxetine 20 MG capsule Commonly known as:  PROZAC Take 20 mg by mouth daily.   predniSONE 10 MG tablet Commonly known as:  DELTASONE Take 4  Tablets daily  for 5 days, then 3 tablets daily  for 5 days then 2 tablets daily for 5 days then one  Tablet daily for 5 days.   tacrolimus 0.1 % ointment Commonly known as:  PROTOPIC Apply 1 application topically at bedtime.      Follow-up Information    Jimmye Norman, MD Follow up.   Specialty:  General Surgery Why:  our office is scheduling you for a follow up appointment in 2-3 weeks. please call to confirm appointment date/time. please arrive 30 minutes early and bring you insurance information/photo ID. Contact information: [address] [place] [address] [phone]        Audie Box L, DO Follow up in 1 week(s).   Specialty:  Pulmonary Disease Contact information: [address] [place] [address] [phone]          No Known Allergies  Consultations:  CCM  Surgery    Procedures/Studies: Dg Chest 2 View  Result Date: 11/14/2017 CLINICAL DATA:  22 year old male with a history of respiratory failure EXAM: CHEST - 2 VIEW COMPARISON:  Multiple prior, most recent 11/13/2017.  CT 11/10/2017 FINDINGS: Cardiomediastinal silhouette unchanged in size and contour. Persisting bilateral interstitial and airspace opacities in the mid and lower lungs. Overall, aeration is  improved from the comparison plain film. No pleural effusion. No pneumothorax. IMPRESSION: Improving bilateral airspace disease. As was previously noted on the CT study, differential includes multifocal infection, atypical infection/inflammation, as well as pneumonitis/toxic inhalation. Electronically Signed   By: Gilmer Mor D.O.   On: 11/14/2017 11:15   Dg Chest 2 View  Result Date: 11/08/2017 CLINICAL DATA:  Generalized  chest pain.  Weakness for 5 days. EXAM: CHEST - 2 VIEW COMPARISON:  None. FINDINGS: The heart, hila, and mediastinum are normal. No pneumothorax. Bilateral patchy pulmonary infiltrates it in a somewhat perihilar distribution. No nodules or masses. No other acute abnormalities. IMPRESSION: 1. Bilateral pulmonary infiltrates, in a somewhat perihilar distribution most consistent with pneumonia. This could represent a typical multifocal pneumonia. In the appropriate clinical setting, atypical pneumonias such as pneumocystis should be considered. Recommend follow-up to resolution. Electronically Signed   By: Gerome Sam III M.D   On: 11/08/2017 17:07   Ct Abdomen Pelvis W Contrast  Result Date: 11/08/2017 CLINICAL DATA:  Nausea and vomiting for 4 days.  Fever and chills. EXAM: CT ABDOMEN AND PELVIS WITH CONTRAST TECHNIQUE: Multidetector CT imaging of the abdomen and pelvis was performed using the standard protocol following bolus administration of intravenous contrast. CONTRAST:  ISOVUE-300 IOPAMIDOL (ISOVUE-300) INJECTION 61% COMPARISON:  None. FINDINGS: Lower chest: Patchy ground-glass opacities scattered in both lung bases, indistinctly marginated. Trace left pleural effusion. Hepatobiliary: Unremarkable Pancreas: Unremarkable Spleen: The spleen measures 10.0 by 5.0 by 13.1 cm (volume = 340 cm^3). No focal splenic abnormality. Adrenals/Urinary Tract: There is contrast medium in the collecting systems on the initial images, which lower sensitivity in detecting nonobstructive renal calculi. No abnormal renal parenchymal enhancement. Adrenal glands normal. No hydronephrosis or hydroureter. Urinary bladder unremarkable. Stomach/Bowel: Prior appendectomy. There is some scattered air-levels in proximal loops small bowel. A jejuno jejunal intussusception measures about 3.3 cm in length on image 77/7. A specific cause for the intussusception is not well seen. Vascular/Lymphatic: Unremarkable Reproductive:  Unremarkable Other: No supplemental non-categorized findings. Musculoskeletal: Unremarkable IMPRESSION: 1. There is an approximately 3.3 cm in length region of jejuno jejunal intussusception in the left upper quadrant. Although a specific cause for this is not well seen, outside of childhood most small bowel intussusceptions have a specific lesion or lead point. There are several scattered air-fluid levels in the proximal small bowel as well, including a borderline dilated loop of jejunum extending to the intussusception. 2. Patchy ground-glass opacities in the lung bases without findings of fibrosis. Possibilities may include atypical pneumonia, acute eosinophilic pneumonia, or acute hypersensitivity reaction. There is no cardiomegaly to suggest a cardiogenic cause. However, there is a trace left pleural effusion. Electronically Signed   By: Gaylyn Rong M.D.   On: 11/08/2017 16:11   Ct Chest High Resolution  Result Date: 11/11/2017 CLINICAL DATA:  Inpatient. Hypoxemia. Patient on non-rebreather mask. EXAM: CT CHEST WITHOUT CONTRAST TECHNIQUE: Multidetector CT imaging of the chest was performed following the standard protocol without intravenous contrast. High resolution imaging of the lungs, as well as inspiratory and expiratory imaging, was performed. COMPARISON:  Chest radiograph from earlier today. FINDINGS: Cardiovascular: Normal heart size. No significant pericardial effusion/thickening. Great vessels are normal in course and caliber. Mediastinum/Nodes: No discrete thyroid nodules. Unremarkable esophagus. No axillary adenopathy. Mildly enlarged 1.1 cm right paratracheal node (series 5/image 45). Mildly enlarged 1.2 cm subcarinal node (series 5/image 74). No additional pathologically enlarged mediastinal or discrete hilar nodes on this noncontrast scan. Lungs/Pleura: No pneumothorax. Small dependent bilateral pleural effusions. Extensive  patchy peribronchovascular consolidation and ground-glass  opacity throughout both lungs involving all lung lobes, most prominent in the lower lobes, with sparing of the immediate subpleural lungs. No discrete lung masses or significant pulmonary nodules. No significant bronchiectasis, architectural distortion or frank honeycombing. No significant air trapping on the expiration sequence. Upper abdomen: No acute abnormality. Musculoskeletal:  No aggressive appearing focal osseous lesions. IMPRESSION: 1. Extensive patchy peribronchovascular consolidation and ground-glass opacity throughout both lungs, most prominent in the lower lobes. Differential is broad and includes acute interstitial pneumonia/ARDS, diffuse alveolar hemorrhage (such as from vasculitis), multilobar infection or other causes of noncardiogenic pulmonary edema (drug toxicity, toxic inhalation, near drowning). 2. Mild mediastinal adenopathy, nonspecific, potentially reactive. 3. Small dependent bilateral pleural effusions. Electronically Signed   By: Delbert Phenix M.D.   On: 11/11/2017 09:59   Dg Chest Portable 1 View  Result Date: 11/13/2017 CLINICAL DATA:  Respiratory failure EXAM: PORTABLE CHEST 1 VIEW COMPARISON:  11/12/2017 FINDINGS: Cardiac shadow is stable. The lungs are well aerated bilaterally. Diffuse bilateral infiltrative changes are identified now worse on the right than the left in an almost reversal of infiltrates when compared with the previous day. Right-sided pleural effusion is noted. IMPRESSION: Increasing infiltrate on the right in the mid and lower lung with interval decrease in infiltrate on the left. Stable right effusion is noted. Electronically Signed   By: Alcide Clever M.D.   On: 11/13/2017 10:23   Dg Chest Port 1 View  Result Date: 11/12/2017 CLINICAL DATA:  Respiratory failure. EXAM: PORTABLE CHEST 1 VIEW COMPARISON:  11/11/2017 FINDINGS: The cardiomediastinal silhouette is unchanged with normal heart size. Enteric tube has been removed. Widespread airspace opacities are  again seen throughout both lungs, densest/most confluent in the left lower lung. Right lung aeration appears slightly improved. There is a small right pleural effusion. No pneumothorax is identified. IMPRESSION: 1. Interval removal of enteric tube. 2. Widespread bilateral airspace disease, slightly improved on the right. Electronically Signed   By: Sebastian Ache M.D.   On: 11/12/2017 07:36   Dg Chest Port 1 View  Result Date: 11/11/2017 CLINICAL DATA:  22 year old with respiratory failure. EXAM: PORTABLE CHEST 1 VIEW COMPARISON:  11/10/2017 and 11/08/2017 and chest CT 11/10/2017 FINDINGS: Worsening airspace densities in both lungs, particularly at the lung bases. Heart size remains normal for size. Trachea is midline. Nasogastric tube extends into the abdomen. Negative for a pneumothorax. Again noted are pleural-based densities along the lateral right lower chest and probably represent overlying shadows. No evidence for large pleural effusions. IMPRESSION: Worsening bilateral airspace disease. Electronically Signed   By: Richarda Overlie M.D.   On: 11/11/2017 10:02   Dg Chest Port 1 View  Result Date: 11/10/2017 CLINICAL DATA:  Shortness of Breath EXAM: PORTABLE CHEST 1 VIEW COMPARISON:  11/08/2017 FINDINGS: Cardiac shadow is stable. Bilateral infiltrative changes are noted slightly progressed when compared with the prior exam. Small right pleural effusion is now seen. No bony abnormality is noted. IMPRESSION: Slight progression in bilateral infiltrates with small right pleural effusion. Electronically Signed   By: Alcide Clever M.D.   On: 11/10/2017 09:05   Dg Abd Portable 1v  Result Date: 11/11/2017 CLINICAL DATA:  NG tube placement. EXAM: PORTABLE ABDOMEN - 1 VIEW COMPARISON:  Chest x-ray 09/04/2017.  CT 11/08/2017. FINDINGS: NG tube noted with tip in the stomach. No gastric distention. Diffuse progressive bilateral pulmonary infiltrates noted. Bilateral pleural effusions noted on today's exam. Linear density  is noted over the left chest. This may represent a  skin fold and/or pleural thickening as lung markings are noted distal to this linear density. However to further evaluate this finding and the remainder of the chest, PA lateral chest x-ray suggested. No acute bony abnormality. IMPRESSION: 1.  NG tube noted with tip in the stomach.  No gastric distention. 2. Diffuse progressive bilateral pulmonary infiltrates. New bilateral pleural effusions. Linear density noted over the left mid chest as described above. PA and lateral chest x-ray suggested for further evaluation. Electronically Signed   By: Maisie Fushomas  Register   On: 11/11/2017 07:48      Subjective: He is feeling better, breathing better.  Oxygen sat on RA on ambulation.   Discharge Exam: Vitals:   11/14/17 1018 11/14/17 1100  BP:    Pulse: (!) 56   Resp:    Temp:    SpO2: 97% 95%   Vitals:   11/13/17 2200 11/14/17 0528 11/14/17 1018 11/14/17 1100  BP: 118/61 139/77    Pulse: (!) 55 (!) 52 (!) 56   Resp: 19 16    Temp: 98.9 F (37.2 C) 98.4 F (36.9 C)    TempSrc: Oral Oral    SpO2: 100% 99% 97% 95%  Weight:      Height:        General: Pt is alert, awake, not in acute distress Cardiovascular: RRR, S1/S2 +, no rubs, no gallops Respiratory: CTA bilaterally, no wheezing, no rhonchi Abdominal: Soft, NT, ND, bowel sounds + Extremities: no edema, no cyanosis    The results of significant diagnostics from this hospitalization (including imaging, microbiology, ancillary and laboratory) are listed below for reference.     Microbiology: Recent Results (from the past 240 hour(s))  Blood culture (routine x 2)     Status: None   Collection Time: 11/08/17  2:30 PM  Result Value Ref Range Status   Specimen Description BLOOD RIGHT ANTECUBITAL  Final   Special Requests   Final    BOTTLES DRAWN AEROBIC AND ANAEROBIC Blood Culture adequate volume   Culture   Final    NO GROWTH 5 DAYS Performed at [facility], [address] [place], [place] [address]    Report Status 11/13/2017 FINAL  Final  Blood culture (routine x 2)     Status: None   Collection Time: 11/08/17  3:23 PM  Result Value Ref Range Status   Specimen Description BLOOD LEFT ANTECUBITAL  Final   Special Requests   Final    BOTTLES DRAWN AEROBIC AND ANAEROBIC Blood Culture adequate volume   Culture   Final    NO GROWTH 5 DAYS Performed at [facility], [address] [place], [place] [address]    Report Status 11/13/2017 FINAL  Final  Respiratory Panel by PCR     Status: None   Collection Time: 11/10/17  1:34 PM  Result Value Ref Range Status   Adenovirus NOT DETECTED NOT DETECTED Final   Coronavirus 229E NOT DETECTED NOT DETECTED Final   Coronavirus HKU1 NOT DETECTED NOT DETECTED Final   Coronavirus NL63 NOT DETECTED NOT DETECTED Final   Coronavirus OC43 NOT DETECTED NOT DETECTED Final   Metapneumovirus NOT DETECTED NOT DETECTED Final   Rhinovirus / Enterovirus NOT DETECTED NOT DETECTED Final   Influenza A NOT DETECTED NOT DETECTED Final   Influenza B NOT DETECTED NOT DETECTED Final   Parainfluenza Virus 1 NOT DETECTED NOT DETECTED Final   Parainfluenza Virus 2 NOT DETECTED NOT DETECTED Final   Parainfluenza Virus 3 NOT DETECTED NOT  DETECTED Final   Parainfluenza Virus 4 NOT DETECTED NOT DETECTED Final   Respiratory Syncytial Virus NOT DETECTED NOT DETECTED Final   Bordetella pertussis NOT DETECTED NOT DETECTED Final   Chlamydophila pneumoniae NOT DETECTED NOT DETECTED Final   Mycoplasma pneumoniae NOT DETECTED NOT DETECTED Final    Comment: Performed at [facility], [address], [place], [place] [address]  MRSA PCR Screening     Status: None   Collection Time: 11/10/17  2:14 PM  Result Value Ref Range Status   MRSA by PCR NEGATIVE NEGATIVE Final    Comment:        The GeneXpert MRSA Assay (FDA approved for NASAL specimens only), is one component of a comprehensive MRSA colonization surveillance program. It is  not intended to diagnose MRSA infection nor to guide or monitor treatment for MRSA infections. Performed at [facility], [address] [place], [place] [address]   Fungus culture, blood     Status: None (Preliminary result)   Collection Time: 11/10/17  2:25 PM  Result Value Ref Range Status   Specimen Description BLOOD RIGHT ANTECUBITAL  Final   Special Requests   Final    BOTTLES DRAWN AEROBIC AND ANAEROBIC Blood Culture adequate volume   Culture   Final    NO GROWTH 3 DAYS Performed at [facility], [address], [place], [place] [address]    Report Status PENDING  Incomplete  Expectorated sputum assessment w rflx to resp cult     Status: None   Collection Time: 11/11/17  4:18 PM  Result Value Ref Range Status   Specimen Description EXPECTORATED SPUTUM  Final   Special Requests NONE  Final   Sputum evaluation   Final    THIS SPECIMEN IS ACCEPTABLE FOR SPUTUM CULTURE Performed at [facility], [address], [place], [place] [address]    Report Status 11/11/2017 FINAL  Final  Culture, respiratory     Status: None   Collection Time: 11/11/17  4:18 PM  Result Value Ref Range Status   Specimen Description EXPECTORATED SPUTUM  Final   Special Requests NONE Reflexed from [id number]  Final   Gram Stain   Final    RARE WBC PRESENT, PREDOMINANTLY PMN NO ORGANISMS SEEN    Culture   Final    RARE Consistent with normal respiratory flora. Performed at [facility], [address], [place], [place] [address]    Report Status 11/14/2017 FINAL  Final     Labs: BNP (last 3 results) No results for input(s): BNP in the last 8760 hours. Basic Metabolic Panel: Recent Labs  Lab 11/08/17 1346 11/09/17 0345 11/11/17 0950 11/12/17 0258 11/13/17 0302  NA 141 141 140 140 141  K 3.8 4.5 4.1 4.0 4.4  CL 103 107 105 101 102  CO2 23 26 27 29 29   GLUCOSE 126* 183* 108* 130* 135*  BUN 13 12 7 11 18   CREATININE 0.99 0.91 0.78 0.77 0.74  CALCIUM 9.6 8.7* 8.4*  9.1 9.0  MG  --   --   --  2.1  --    Liver Function Tests: Recent Labs  Lab 11/08/17 1346 11/11/17 0950  AST 26 26  ALT 18 17  ALKPHOS 118 82  BILITOT 1.3* 0.6  PROT 7.0 5.3*  ALBUMIN 3.1* 2.1*   Recent Labs  Lab 11/08/17 1346  LIPASE 23   No results for input(s): AMMONIA in the last 168 hours. CBC: Recent Labs  Lab 11/10/17 1424 11/11/17 0950 11/12/17 0258 11/13/17 0302 11/14/17 1044  WBC 15.2* 14.4* 17.5* 16.7* 13.0*  NEUTROABS 13.4*  --   --   --   --   HGB 12.3* 11.7* 11.9* 12.3* 13.8  HCT 35.6* 34.2* 34.8* 35.8* 42.5  MCV 95.7 95.5 94.8 94.7 96.6  PLT 276 266 284 359 438*   Cardiac Enzymes: No results for input(s): CKTOTAL, CKMB, CKMBINDEX, TROPONINI in the last 168 hours. BNP: Invalid input(s): POCBNP CBG: Recent Labs  Lab 11/10/17 1512 11/11/17 2001  GLUCAP 87 155*   D-Dimer No results for input(s): DDIMER in the last 72 hours. Hgb A1c No results for input(s): HGBA1C in the last 72 hours. Lipid Profile No results for input(s): CHOL, HDL, LDLCALC, TRIG, CHOLHDL, LDLDIRECT in the last 72 hours. Thyroid function studies No results for input(s): TSH, T4TOTAL, T3FREE, THYROIDAB in the last 72 hours.  Invalid input(s): FREET3 Anemia work up No results for input(s): VITAMINB12, FOLATE, FERRITIN, TIBC, IRON, RETICCTPCT in the last 72 hours. Urinalysis    Component Value Date/Time   COLORURINE AMBER (A) 11/08/2017 1410   APPEARANCEUR HAZY (A) 11/08/2017 1410   LABSPEC 1.035 (H) 11/08/2017 1410   PHURINE 8.0 11/08/2017 1410   GLUCOSEU NEGATIVE 11/08/2017 1410   HGBUR NEGATIVE 11/08/2017 1410   BILIRUBINUR NEGATIVE 11/08/2017 1410   KETONESUR 80 (A) 11/08/2017 1410   PROTEINUR >=300 (A) 11/08/2017 1410   NITRITE NEGATIVE 11/08/2017 1410   LEUKOCYTESUR NEGATIVE 11/08/2017 1410   Sepsis Labs Invalid input(s): PROCALCITONIN,  WBC,  LACTICIDVEN Microbiology Recent Results (from the past 240 hour(s))  Blood culture (routine x 2)     Status: None    Collection Time: 11/08/17  2:30 PM  Result Value Ref Range Status   Specimen Description BLOOD RIGHT ANTECUBITAL  Final   Special Requests   Final    BOTTLES DRAWN AEROBIC AND ANAEROBIC Blood Culture adequate volume   Culture   Final    NO GROWTH 5 DAYS Performed at [facility], [address] [place], [place] [address]    Report Status 11/13/2017 FINAL  Final  Blood culture (routine x 2)     Status: None   Collection Time: 11/08/17  3:23 PM  Result Value Ref Range Status   Specimen Description BLOOD LEFT ANTECUBITAL  Final   Special Requests   Final    BOTTLES DRAWN AEROBIC AND ANAEROBIC Blood Culture adequate volume   Culture   Final    NO GROWTH 5 DAYS Performed at [facility], [address] [place], [place] [address]    Report Status 11/13/2017 FINAL  Final  Respiratory Panel by PCR     Status: None   Collection Time: 11/10/17  1:34 PM  Result Value Ref Range Status   Adenovirus NOT DETECTED NOT DETECTED Final   Coronavirus 229E NOT DETECTED NOT DETECTED Final   Coronavirus HKU1 NOT DETECTED NOT DETECTED Final   Coronavirus NL63 NOT DETECTED NOT DETECTED Final   Coronavirus OC43 NOT DETECTED NOT DETECTED Final   Metapneumovirus NOT DETECTED NOT DETECTED Final   Rhinovirus / Enterovirus NOT DETECTED NOT DETECTED Final   Influenza A NOT DETECTED NOT DETECTED Final   Influenza B NOT DETECTED NOT DETECTED Final   Parainfluenza Virus 1 NOT DETECTED NOT DETECTED Final   Parainfluenza Virus 2 NOT DETECTED NOT DETECTED Final   Parainfluenza Virus 3 NOT DETECTED NOT DETECTED Final   Parainfluenza Virus 4 NOT DETECTED NOT DETECTED Final   Respiratory Syncytial  Virus NOT DETECTED NOT DETECTED Final   Bordetella pertussis NOT DETECTED NOT DETECTED Final   Chlamydophila pneumoniae NOT DETECTED NOT DETECTED Final   Mycoplasma pneumoniae NOT DETECTED NOT DETECTED Final    Comment: Performed at [facility], [address] [place], [place] [address]  MRSA PCR  Screening     Status: None   Collection Time: 11/10/17  2:14 PM  Result Value Ref Range Status   MRSA by PCR NEGATIVE NEGATIVE Final    Comment:        The GeneXpert MRSA Assay (FDA approved for NASAL specimens only), is one component of a comprehensive MRSA colonization surveillance program. It is not intended to diagnose MRSA infection nor to guide or monitor treatment for MRSA infections. Performed at [facility], [address], [place], [place] [address]   Fungus culture, blood     Status: None (Preliminary result)   Collection Time: 11/10/17  2:25 PM  Result Value Ref Range Status   Specimen Description BLOOD RIGHT ANTECUBITAL  Final   Special Requests   Final    BOTTLES DRAWN AEROBIC AND ANAEROBIC Blood Culture adequate volume   Culture   Final    NO GROWTH 3 DAYS Performed at [facility], [address] [place], [place] [address]    Report Status PENDING  Incomplete  Expectorated sputum assessment w rflx to resp cult     Status: None   Collection Time: 11/11/17  4:18 PM  Result Value Ref Range Status   Specimen Description EXPECTORATED SPUTUM  Final   Special Requests NONE  Final   Sputum evaluation   Final    THIS SPECIMEN IS ACCEPTABLE FOR SPUTUM CULTURE Performed at [facility], [address] [place], [place] [address]    Report Status 11/11/2017 FINAL  Final  Culture, respiratory     Status: None   Collection Time: 11/11/17  4:18 PM  Result Value Ref Range Status   Specimen Description EXPECTORATED SPUTUM  Final   Special Requests NONE Reflexed from [id number]  Final   Gram Stain   Final    RARE WBC PRESENT, PREDOMINANTLY PMN NO ORGANISMS SEEN    Culture   Final    RARE Consistent with normal respiratory flora. Performed at [facility], [address] [place], [place] [address]    Report Status 11/14/2017 FINAL  Final     Time coordinating discharge: 35 minutes.   SIGNED:   Alba Cory, MD  [facility] 11/14/2017, 11:42 AM Pager [phone]  If 7PM-7AM, please contact night-coverage [email or web address] Password [email or web address]

## 2017-11-14 NOTE — Progress Notes (Signed)
Pt walked around the hall with no oxygen, tolerationg well, O2 sat was 96%, pt stated a little bit tired from walking, pt's mom wants O2 back on at 2lpm upon reaching pt's room

## 2017-11-17 LAB — FUNGUS CULTURE, BLOOD
Culture: NO GROWTH
Special Requests: ADEQUATE

## 2017-11-18 ENCOUNTER — Telehealth: Payer: Self-pay | Admitting: Pulmonary Disease

## 2017-11-18 NOTE — Telephone Encounter (Signed)
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   BI please advise if okay to print work note from 11/11/2017 until 11/26/2017.

## 2017-11-18 NOTE — Telephone Encounter (Signed)
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 Per Judeth Cornfield they will obtain during office visit on 11/26/2017. Nothing further needed at this time.

## 2017-11-18 NOTE — Telephone Encounter (Signed)
Ok for a work note until seen by me in clinic. Thanks Josephine Igo, DO [facility] 11/18/2017 12:44 PM

## 2017-11-26 ENCOUNTER — Encounter: Payer: Self-pay | Admitting: Pulmonary Disease

## 2017-11-26 ENCOUNTER — Ambulatory Visit: Payer: BLUE CROSS/BLUE SHIELD | Admitting: Pulmonary Disease

## 2017-11-26 ENCOUNTER — Ambulatory Visit (INDEPENDENT_AMBULATORY_CARE_PROVIDER_SITE_OTHER)
Admission: RE | Admit: 2017-11-26 | Discharge: 2017-11-26 | Disposition: A | Discharge status: Home / Self Care | Payer: BLUE CROSS/BLUE SHIELD | Source: Ambulatory Visit | Attending: Pulmonary Disease | Admitting: Pulmonary Disease

## 2017-11-26 VITALS — BP 124/78 | HR 92 | Ht 66.0 in | Wt 152.6 lb

## 2017-11-26 DIAGNOSIS — Z789 Other specified health status: Secondary | ICD-10-CM

## 2017-11-26 DIAGNOSIS — J849 Interstitial pulmonary disease, unspecified: Secondary | ICD-10-CM | POA: Diagnosis not present

## 2017-11-26 DIAGNOSIS — F1210 Cannabis abuse, uncomplicated: Secondary | ICD-10-CM | POA: Diagnosis not present

## 2017-11-26 DIAGNOSIS — R0602 Shortness of breath: Secondary | ICD-10-CM | POA: Diagnosis not present

## 2017-11-26 DIAGNOSIS — J9601 Acute respiratory failure with hypoxia: Secondary | ICD-10-CM | POA: Diagnosis not present

## 2017-11-26 DIAGNOSIS — K561 Intussusception: Secondary | ICD-10-CM | POA: Diagnosis not present

## 2017-11-26 NOTE — Patient Instructions (Signed)
RTC as needed Complete prednisone taper

## 2017-11-26 NOTE — Progress Notes (Signed)
Synopsis: Hospital follow-up in September 2019 for respiratory failure secondary to vaping induced lung disease.  Subjective:   PATIENT ID: Zachary Gill GENDER: male DOB: Aug 25, 1995, MRN: [id number]  Chief Complaint  Patient presents with  . Hospitalization Follow-up    States his breathing is alot better but he does have some pain in his rib area on right side. Will finish prednisone next week.     This is a young gentleman that was admitted to the hospital on 11/08/2017 for acute hypoxemic respiratory failure at one point requiring high flow nasal cannula.  Initial thoughts due to abdominal pain fever nausea and vomiting and CT imaging concerning for intussusception patient was taken to the OR for exploratory laparotomy.  He was found not to have a intussusception.  CT imaging revealed diffuse bilateral infiltrates and groundglass opacities.  Past medical history included use of vaping as well as a process known as waxing.  Which is inhalation of hot pressed cannabis leaf oil.  Due to the patient's tenuous respiratory status and discussion with patient and family decision was made for not to complete bronchoscopy and empiric treatment with steroids.  The patient began to improve over the weekend and the decision was made to discharge with follow-up.  Patient presents today for follow-up from his recent hospitalization.  He states that his breathing is much better.  He denies any chest pain, nausea, vomiting, wheezing, dyspnea on exertion, fevers, chills, night sweats, weight loss.  He recently went back to the gym for the first time since his hospitalization and he feels as if his right sided rib pain came from overworking himself at the gym.Marland Kitchen  He did feel that he was a little tired and more weak than previous.  He denies any hemoptysis.  His mother is very concerned about his recovery.  He is anxious about returning to work.  I feel if there is no limitations for him to return to work at this  time.  Mother also has several questions about his future prognosis on the development of lung disease.  I explained her that it is very unknown at this time as to if he will have any additional limitations.  At this point he looks much better and is feeling better.  Repeat chest x-ray today reveals normal bilateral lung fields with improvement of the opacities that was seen during his hospitalization.   Past Medical History:  Diagnosis Date  . Vitiligo      Family History  Problem Relation Age of Onset  . Lung cancer Neg Hx   . Lung disease Neg Hx      Social History   Socioeconomic History  . Marital status: Single    Spouse name: Not on file  . Number of children: Not on file  . Years of education: Not on file  . Highest education level: Not on file  Occupational History  . Not on file  Social Needs  . Financial resource strain: Not on file  . Food insecurity:    Worry: Not on file    Inability: Not on file  . Transportation needs:    Medical: Not on file    Non-medical: Not on file  Tobacco Use  . Smoking status: Never Smoker  . Smokeless tobacco: Never Used  . Tobacco comment: quit 11/08/2017  Substance and Sexual Activity  . Alcohol use: Yes    Comment: 14 shots binge drinker  . Drug use: No  . Sexual activity: Yes  Birth control/protection: Condom  Lifestyle  . Physical activity:    Days per week: Not on file    Minutes per session: Not on file  . Stress: Not on file  Relationships  . Social connections:    Talks on phone: Not on file    Gets together: Not on file    Attends religious service: Not on file    Active member of club or organization: Not on file    Attends meetings of clubs or organizations: Not on file    Relationship status: Not on file  . Intimate partner violence:    Fear of current or ex partner: Not on file    Emotionally abused: Not on file    Physically abused: Not on file    Forced sexual activity: Not on file  Other Topics  Concern  . Not on file  Social History Narrative  . Not on file     No Known Allergies   Outpatient Medications Prior to Visit  Medication Sig Dispense Refill  . acetaminophen (TYLENOL) 500 MG tablet Take 1,000 mg by mouth every 6 (six) hours as needed for mild pain, moderate pain, fever or headache.    Marland Kitchen FLUoxetine (PROZAC) 20 MG capsule Take 20 mg by mouth daily.  1  . predniSONE (DELTASONE) 10 MG tablet Take 4  Tablets daily  for 5 days, then 3 tablets daily  for 5 days then 2 tablets daily for 5 days then one  Tablet daily for 5 days. 50 tablet 0  . tacrolimus (PROTOPIC) 0.1 % ointment Apply 1 application topically at bedtime.  11   No facility-administered medications prior to visit.     Review of Systems  Constitutional: Negative for chills, fever, malaise/fatigue and weight loss.  HENT: Negative for hearing loss, sore throat and tinnitus.   Eyes: Negative for blurred vision and double vision.  Respiratory: Negative for cough, hemoptysis, sputum production, shortness of breath, wheezing and stridor.   Cardiovascular: Negative for chest pain, palpitations, orthopnea, leg swelling and PND.  Gastrointestinal: Negative for abdominal pain, constipation, diarrhea, heartburn, nausea and vomiting.  Genitourinary: Negative for dysuria, hematuria and urgency.  Musculoskeletal: Negative for joint pain and myalgias.  Skin: Negative for itching and rash.  Neurological: Negative for dizziness, tingling, weakness and headaches.  Endo/Heme/Allergies: Negative for environmental allergies. Does not bruise/bleed easily.  Psychiatric/Behavioral: Negative for depression. The patient is not nervous/anxious and does not have insomnia.   All other systems reviewed and are negative.    Objective:  Physical Exam  Constitutional: He is oriented to person, place, and time. He appears well-developed and well-nourished. No distress.  HENT:  Head: Normocephalic and atraumatic.  Mouth/Throat:  Oropharynx is clear and moist.  Eyes: Pupils are equal, round, and reactive to light. Conjunctivae are normal. No scleral icterus.  Neck: Neck supple. No JVD present. No tracheal deviation present.  Cardiovascular: Normal rate, regular rhythm, normal heart sounds and intact distal pulses.  No murmur heard. Pulmonary/Chest: Effort normal and breath sounds normal. No accessory muscle usage or stridor. No tachypnea. No respiratory distress. He has no wheezes. He has no rhonchi. He has no rales.  Abdominal: Soft. Bowel sounds are normal. He exhibits no distension. There is no tenderness.  Musculoskeletal: He exhibits no edema or tenderness.  Lymphadenopathy:    He has no cervical adenopathy.  Neurological: He is alert and oriented to person, place, and time.  Skin: Skin is warm and dry. Capillary refill takes less than 2 seconds. No  rash noted.  Psychiatric: He has a normal mood and affect. His behavior is normal.  Vitals reviewed.    Vitals:   11/26/17 1350  BP: 124/78  Pulse: 92  SpO2: 95%  Weight: 152 lb 9.6 oz (69.2 kg)  Height: 5\' 6"  (1.676 m)   95% on RA BMI Readings from Last 3 Encounters:  11/26/17 24.63 kg/m (69 %, Z= 0.49)*  11/13/17 25.62 kg/m (77 %, Z= 0.74)*  10/10/16 24.14 kg/m (71 %, Z= 0.54)*   * Growth percentiles are based on CDC (Boys, 2-20 Years) data.   Wt Readings from Last 3 Encounters:  11/26/17 152 lb 9.6 oz (69.2 kg) (45 %, Z= -0.11)*  11/13/17 163 lb 9.3 oz (74.2 kg) (62 %, Z= 0.31)*  10/10/16 163 lb 8 oz (74.2 kg) (68 %, Z= 0.45)*   * Growth percentiles are based on CDC (Boys, 2-20 Years) data.     CBC    Component Value Date/Time   WBC 13.0 (H) 11/14/2017 1044   RBC 4.40 11/14/2017 1044   HGB 13.8 11/14/2017 1044   HGB 15.8 10/10/2016 1610   HCT 42.5 11/14/2017 1044   HCT 44.7 10/10/2016 1610   PLT 438 (H) 11/14/2017 1044   PLT 165 10/10/2016 1610   MCV 96.6 11/14/2017 1044   MCV 95.7 10/10/2016 1610   MCH 31.4 11/14/2017 1044   MCHC  32.5 11/14/2017 1044   RDW 12.4 11/14/2017 1044   RDW 12.7 10/10/2016 1610   LYMPHSABS 1.3 11/10/2017 1424   LYMPHSABS 3.2 10/10/2016 1610   MONOABS 0.4 11/10/2017 1424   MONOABS 0.8 10/10/2016 1610   EOSABS 0.1 11/10/2017 1424   EOSABS 0.2 10/10/2016 1610   BASOSABS 0.0 11/10/2017 1424   BASOSABS 0.0 10/10/2016 1610    Chest Imaging: HRCT 11/10/2017: Extensive bilateral patchy peribronchovascular groundglass opacities and interstitial thickening.  Small bilateral pleural effusion.  Chest x-ray 02/03/2018: Resolution of bilateral pulmonary opacities.  Normal chest x-ray  The patient's images have been independently reviewed by me.    Pulmonary Functions Testing Results: No results found for: FEV1, FVC, FEV1FVC, TLC, DLCO  FeNO: None  Pathology: None  Echocardiogram: None  Heart Catheterization: None    Assessment & Plan:   Acute hypoxemic respiratory failure (HCC)  Vaping, Electronic cigarette use   Marijuana abuse, Waxing  - Waxing, method of heating MJ leaf between two hot plates to extract the oil followed by smoking the oil  Drug Induced - ILD (interstitial lung disease) (HCC) - Vaping Induced Lung Injury   Discussion:  This is a young gentleman recently admitted to the hospital for vaping induced lung injury, drug-induced ILD.  Overall with dramatic improvement completing steroid taper will end next Thursday.  Repeat chest x-ray with dramatic improvement of bilateral opacities.  Currently the patient is experiencing no respiratory symptoms or complaints.  Patient can return to work with no limitations. Was counseled on smoking cessation as well as vaping cessation.  Patient was counseled on the unsure effects/long-term effects of what has happened.  Explained to him and his mother that he may very well be at higher risk for development of small airway disease in the future or development of bronchial reactivity or restrictive lung disease.  Patient was counseled  if he was to develop worsening shortness of breath, wheezing or symptoms return to contact her office and let us know.  Otherwise patient can return to clinic as needed.   Current Outpatient Medications:  .  acetaminophen (TYLENOL) 500 MG tablet, Take  1,000 mg by mouth every 6 (six) hours as needed for mild pain, moderate pain, fever or headache., Disp: , Rfl:  .  FLUoxetine (PROZAC) 20 MG capsule, Take 20 mg by mouth daily., Disp: , Rfl: 1 .  predniSONE (DELTASONE) 10 MG tablet, Take 4  Tablets daily  for 5 days, then 3 tablets daily  for 5 days then 2 tablets daily for 5 days then one  Tablet daily for 5 days., Disp: 50 tablet, Rfl: 0 .  tacrolimus (PROTOPIC) 0.1 % ointment, Apply 1 application topically at bedtime., Disp: , Rfl: 11   Josephine Igo, DO [facility] 11/26/2017 7:35 PM

## 2018-07-03 DIAGNOSIS — F3289 Other specified depressive episodes: Secondary | ICD-10-CM | POA: Diagnosis not present

## 2018-07-03 DIAGNOSIS — F419 Anxiety disorder, unspecified: Secondary | ICD-10-CM | POA: Diagnosis not present

## 2018-08-05 DIAGNOSIS — F1290 Cannabis use, unspecified, uncomplicated: Secondary | ICD-10-CM | POA: Diagnosis not present

## 2018-08-05 DIAGNOSIS — F411 Generalized anxiety disorder: Secondary | ICD-10-CM | POA: Diagnosis not present

## 2018-08-05 DIAGNOSIS — F6381 Intermittent explosive disorder: Secondary | ICD-10-CM | POA: Diagnosis not present

## 2018-08-26 DIAGNOSIS — F6381 Intermittent explosive disorder: Secondary | ICD-10-CM | POA: Diagnosis not present

## 2018-08-26 DIAGNOSIS — F1290 Cannabis use, unspecified, uncomplicated: Secondary | ICD-10-CM | POA: Diagnosis not present

## 2018-08-26 DIAGNOSIS — F411 Generalized anxiety disorder: Secondary | ICD-10-CM | POA: Diagnosis not present

## 2018-09-15 DIAGNOSIS — F6381 Intermittent explosive disorder: Secondary | ICD-10-CM | POA: Diagnosis not present

## 2018-09-15 DIAGNOSIS — F1290 Cannabis use, unspecified, uncomplicated: Secondary | ICD-10-CM | POA: Diagnosis not present

## 2018-09-15 DIAGNOSIS — F411 Generalized anxiety disorder: Secondary | ICD-10-CM | POA: Diagnosis not present

## 2018-10-01 DIAGNOSIS — L255 Unspecified contact dermatitis due to plants, except food: Secondary | ICD-10-CM | POA: Diagnosis not present

## 2018-10-02 DIAGNOSIS — Z79891 Long term (current) use of opiate analgesic: Secondary | ICD-10-CM | POA: Diagnosis not present

## 2018-10-02 DIAGNOSIS — F1290 Cannabis use, unspecified, uncomplicated: Secondary | ICD-10-CM | POA: Diagnosis not present

## 2018-10-02 DIAGNOSIS — F6381 Intermittent explosive disorder: Secondary | ICD-10-CM | POA: Diagnosis not present

## 2018-10-02 DIAGNOSIS — F411 Generalized anxiety disorder: Secondary | ICD-10-CM | POA: Diagnosis not present

## 2018-10-07 DIAGNOSIS — F411 Generalized anxiety disorder: Secondary | ICD-10-CM | POA: Diagnosis not present

## 2018-10-07 DIAGNOSIS — F1290 Cannabis use, unspecified, uncomplicated: Secondary | ICD-10-CM | POA: Diagnosis not present

## 2018-10-07 DIAGNOSIS — F6381 Intermittent explosive disorder: Secondary | ICD-10-CM | POA: Diagnosis not present

## 2018-10-26 DIAGNOSIS — F1290 Cannabis use, unspecified, uncomplicated: Secondary | ICD-10-CM | POA: Diagnosis not present

## 2018-10-26 DIAGNOSIS — F411 Generalized anxiety disorder: Secondary | ICD-10-CM | POA: Diagnosis not present

## 2018-10-26 DIAGNOSIS — F6381 Intermittent explosive disorder: Secondary | ICD-10-CM | POA: Diagnosis not present

## 2018-11-26 DIAGNOSIS — F6381 Intermittent explosive disorder: Secondary | ICD-10-CM | POA: Diagnosis not present

## 2018-11-26 DIAGNOSIS — F411 Generalized anxiety disorder: Secondary | ICD-10-CM | POA: Diagnosis not present

## 2018-11-26 DIAGNOSIS — F1290 Cannabis use, unspecified, uncomplicated: Secondary | ICD-10-CM | POA: Diagnosis not present

## 2018-12-29 DIAGNOSIS — F6381 Intermittent explosive disorder: Secondary | ICD-10-CM | POA: Diagnosis not present

## 2018-12-29 DIAGNOSIS — F1290 Cannabis use, unspecified, uncomplicated: Secondary | ICD-10-CM | POA: Diagnosis not present

## 2018-12-29 DIAGNOSIS — F411 Generalized anxiety disorder: Secondary | ICD-10-CM | POA: Diagnosis not present

## 2019-01-15 DIAGNOSIS — Z0000 Encounter for general adult medical examination without abnormal findings: Secondary | ICD-10-CM | POA: Diagnosis not present

## 2019-01-15 DIAGNOSIS — Z23 Encounter for immunization: Secondary | ICD-10-CM | POA: Diagnosis not present

## 2019-01-18 IMAGING — DX DG CHEST 2V
2 series · 2 of 2 positions shown · non-contrast
Comparison: None.

CLINICAL DATA: Generalized chest pain.  Weakness for 5 days.

EXAM:
CHEST - 2 VIEW

[chest pa]
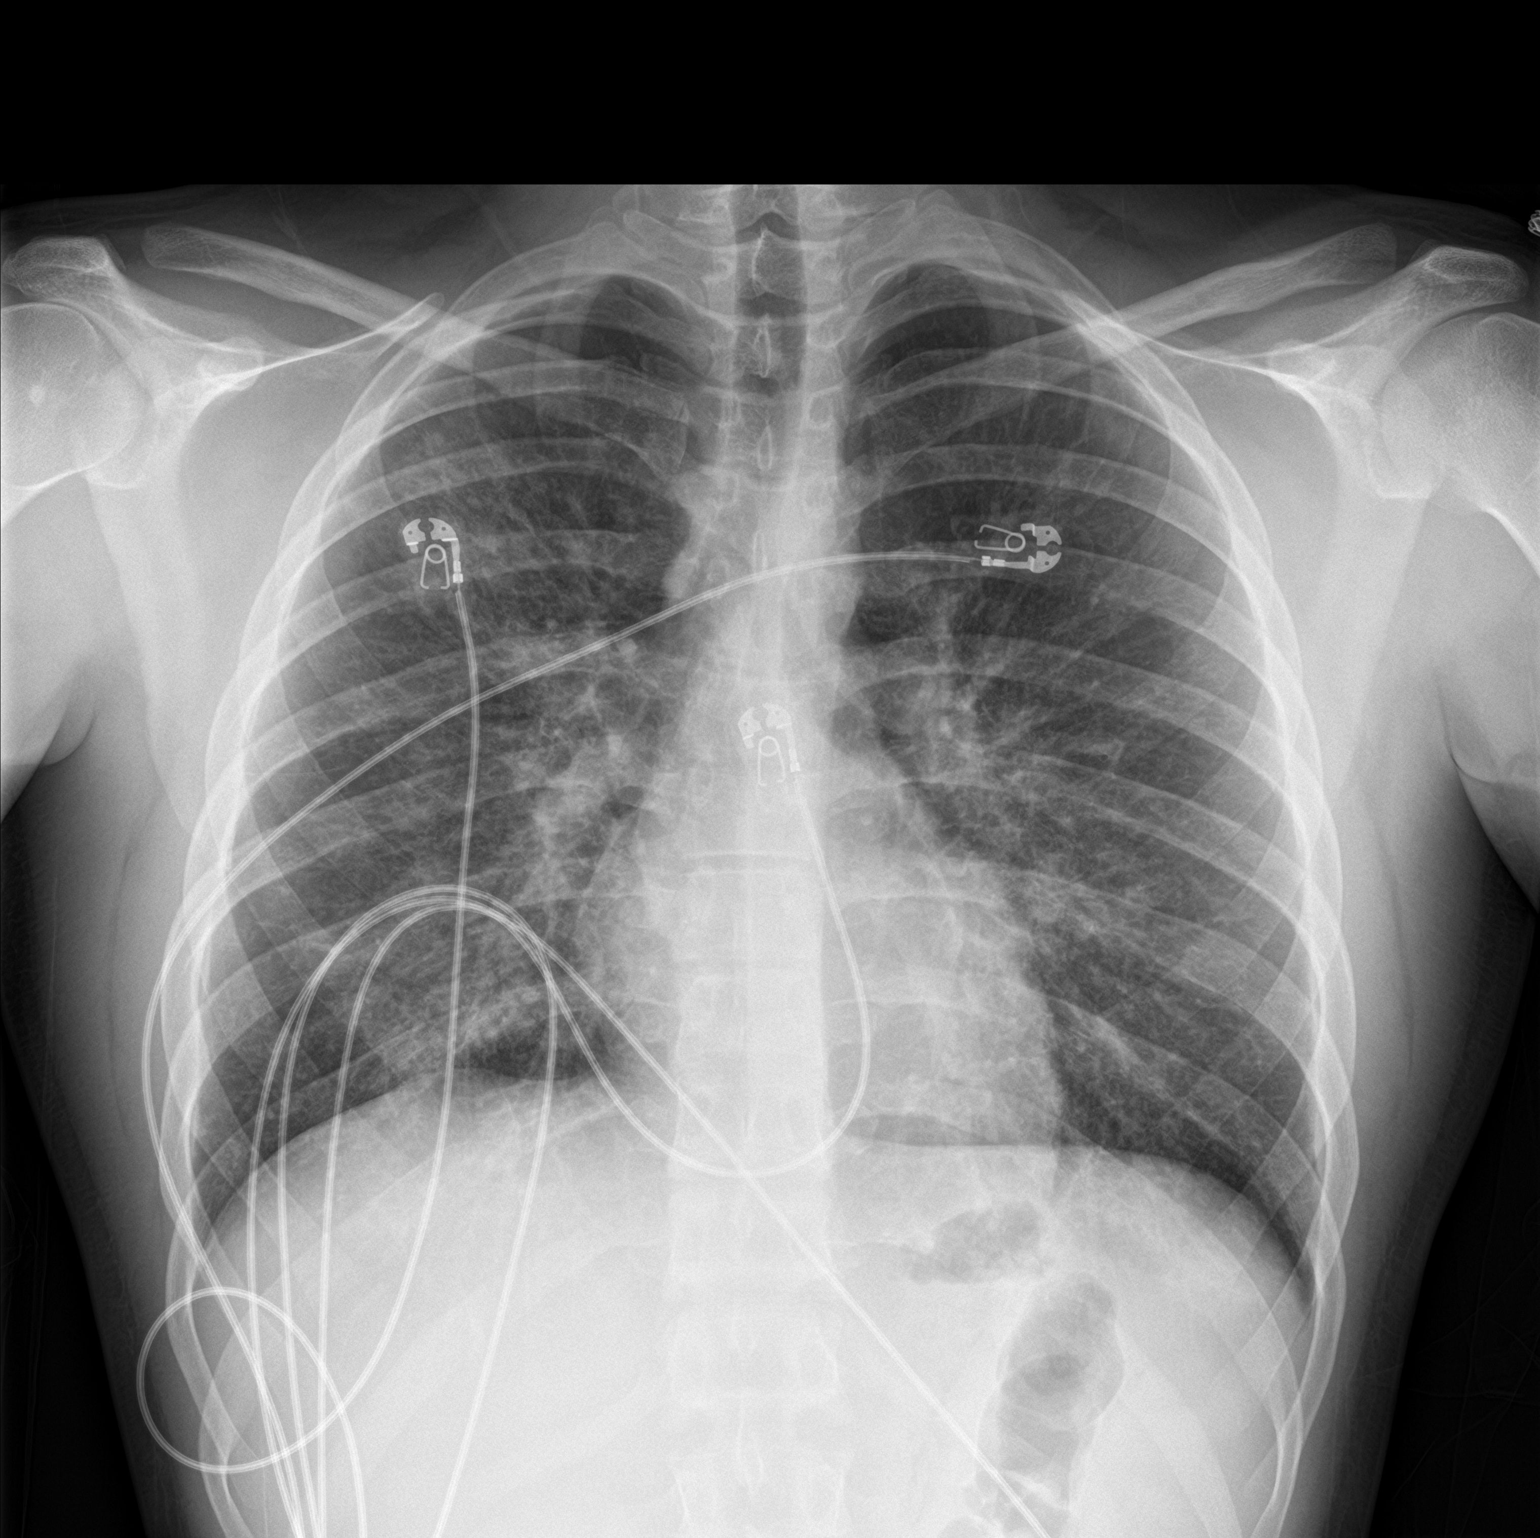

[chest lat]
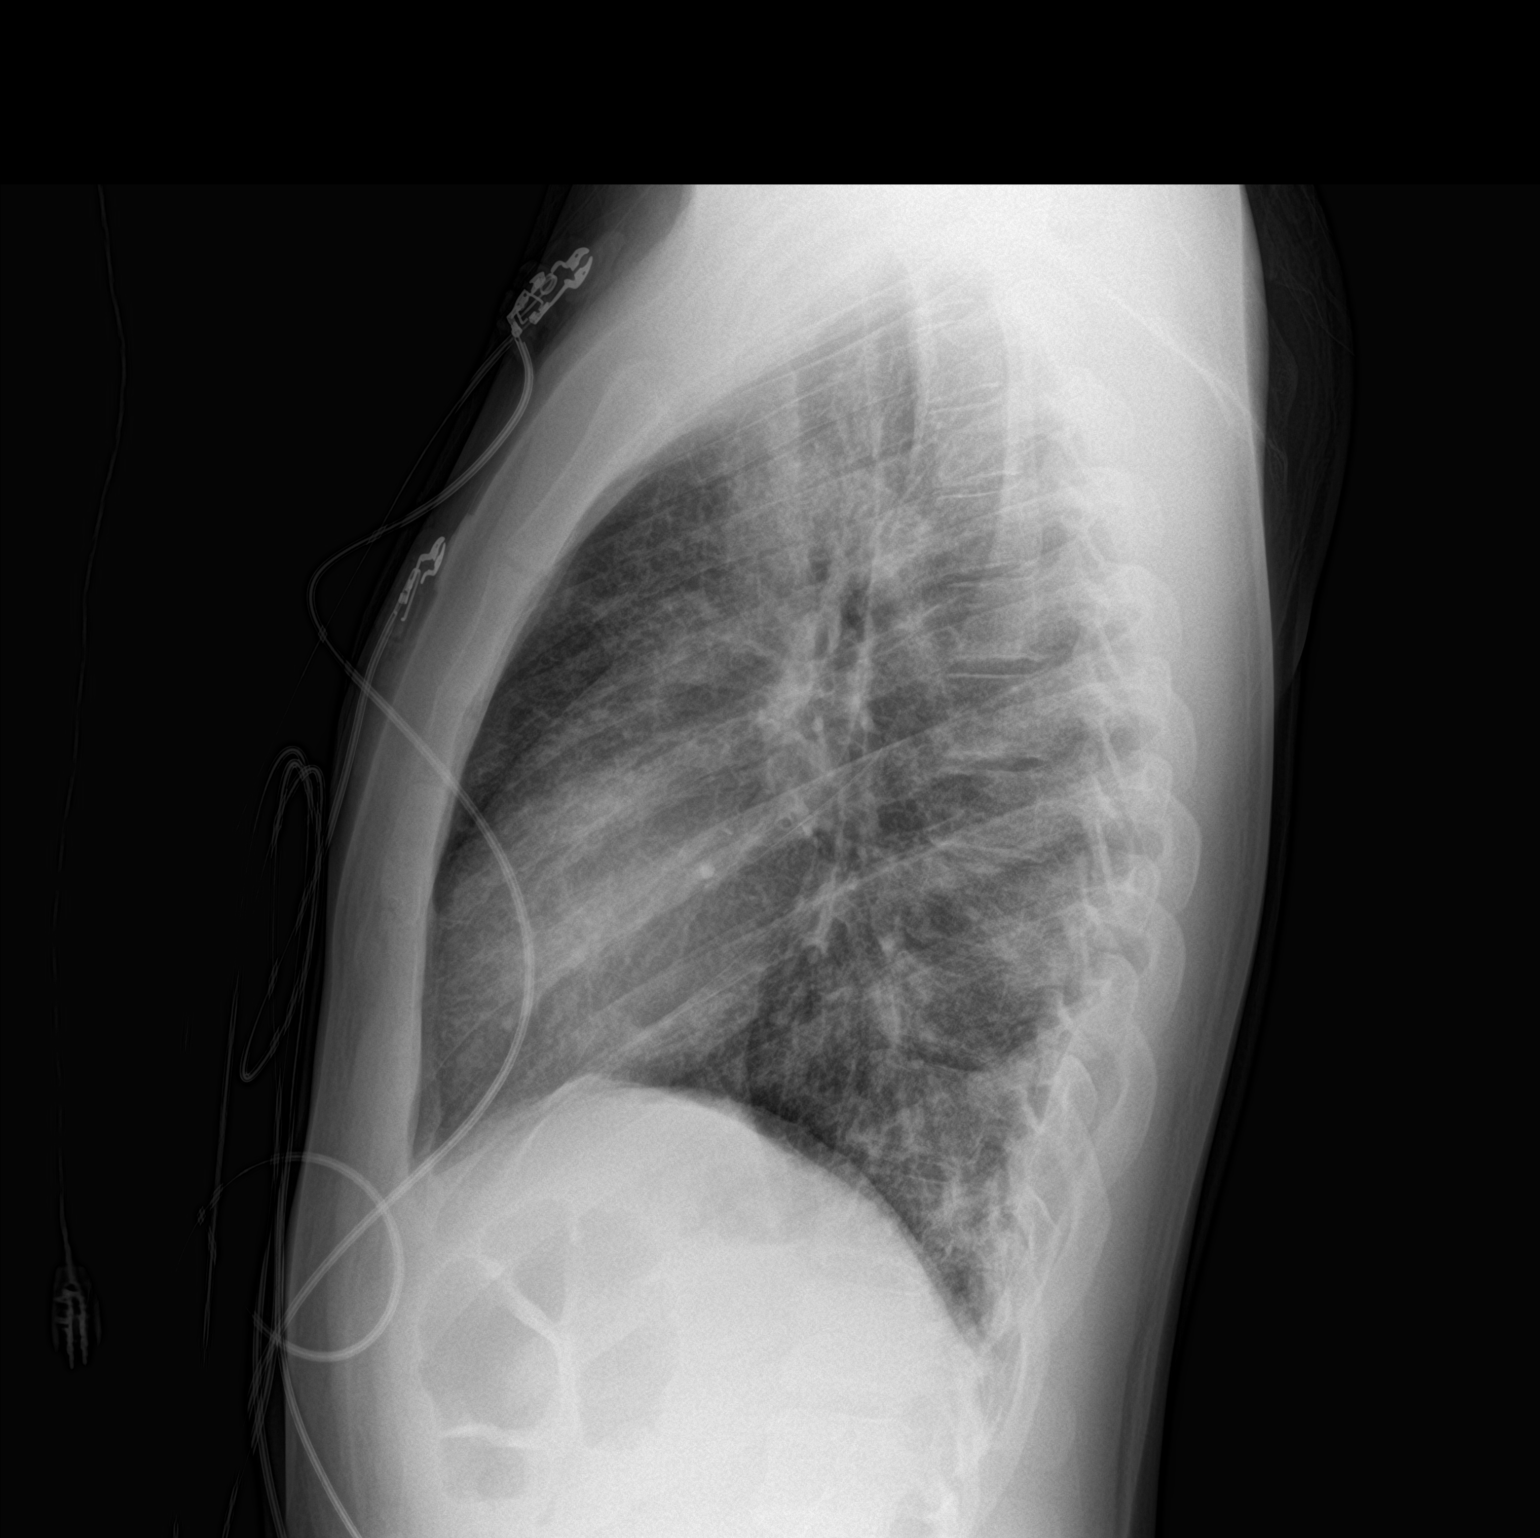

[2 of 2 positions shown; findings below may reference images not displayed]

FINDINGS: The heart, hila, and mediastinum are normal. No pneumothorax.
Bilateral patchy pulmonary infiltrates it in a somewhat perihilar
distribution. No nodules or masses. No other acute abnormalities.
IMPRESSION: 1. Bilateral pulmonary infiltrates, in a somewhat perihilar
distribution most consistent with pneumonia. This could represent a
typical multifocal pneumonia. In the appropriate clinical setting,
atypical pneumonias such as pneumocystis should be considered.
Recommend follow-up to resolution.

## 2019-01-21 IMAGING — DX DG CHEST 1V PORT
1 series · 1 of 1 positions shown · non-contrast
Comparison: 11/10/2017 and 11/08/2017 and chest CT 11/10/2017

CLINICAL DATA: 21-year-old with respiratory failure.

EXAM:
PORTABLE CHEST 1 VIEW

[chest ap]
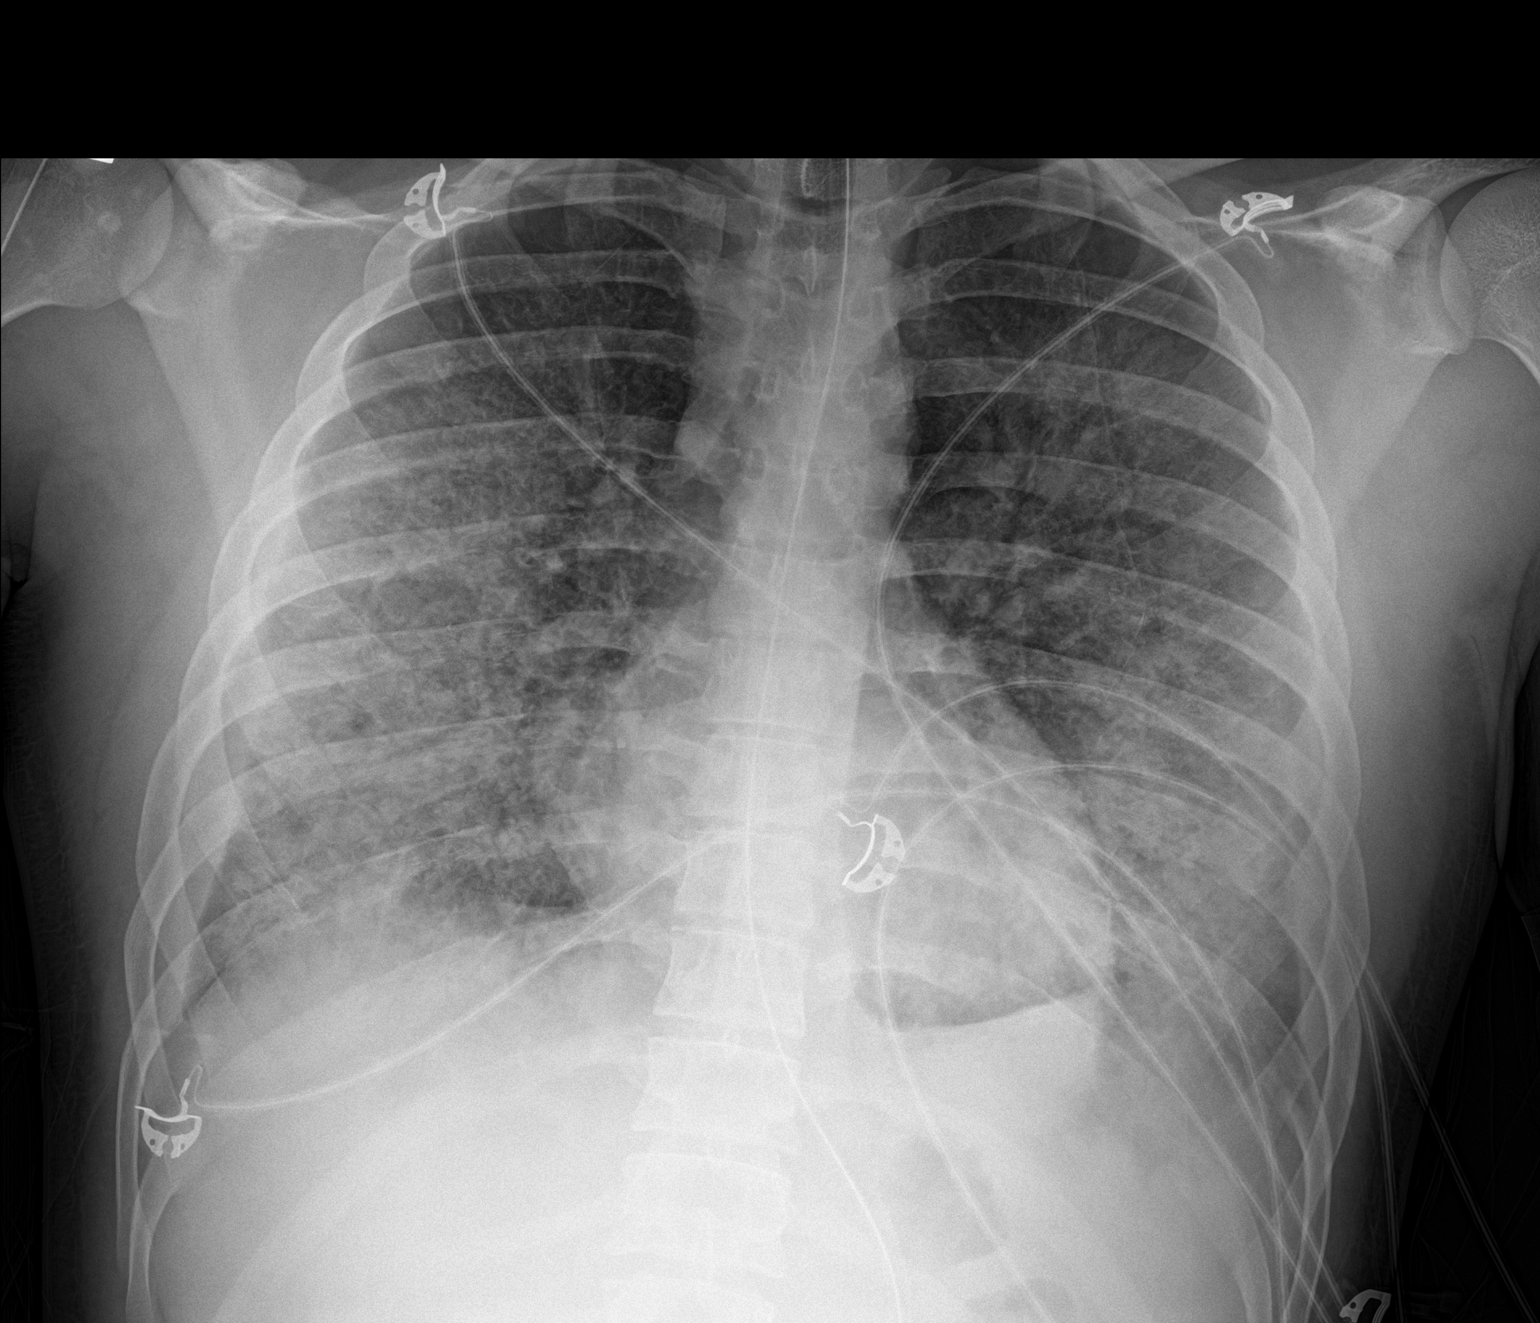

[1 of 1 positions shown; findings below may reference images not displayed]

FINDINGS: Worsening airspace densities in both lungs, particularly at the lung
bases. Heart size remains normal for size. Trachea is midline.
Nasogastric tube extends into the abdomen. Negative for a
pneumothorax. Again noted are pleural-based densities along the
lateral right lower chest and probably represent overlying shadows.
No evidence for large pleural effusions.
IMPRESSION: Worsening bilateral airspace disease.

## 2019-01-23 IMAGING — DX DG CHEST 1V PORT
1 series · 1 of 1 positions shown · non-contrast
Comparison: 11/12/2017

CLINICAL DATA: Respiratory failure

EXAM:
PORTABLE CHEST 1 VIEW

[chest ap]
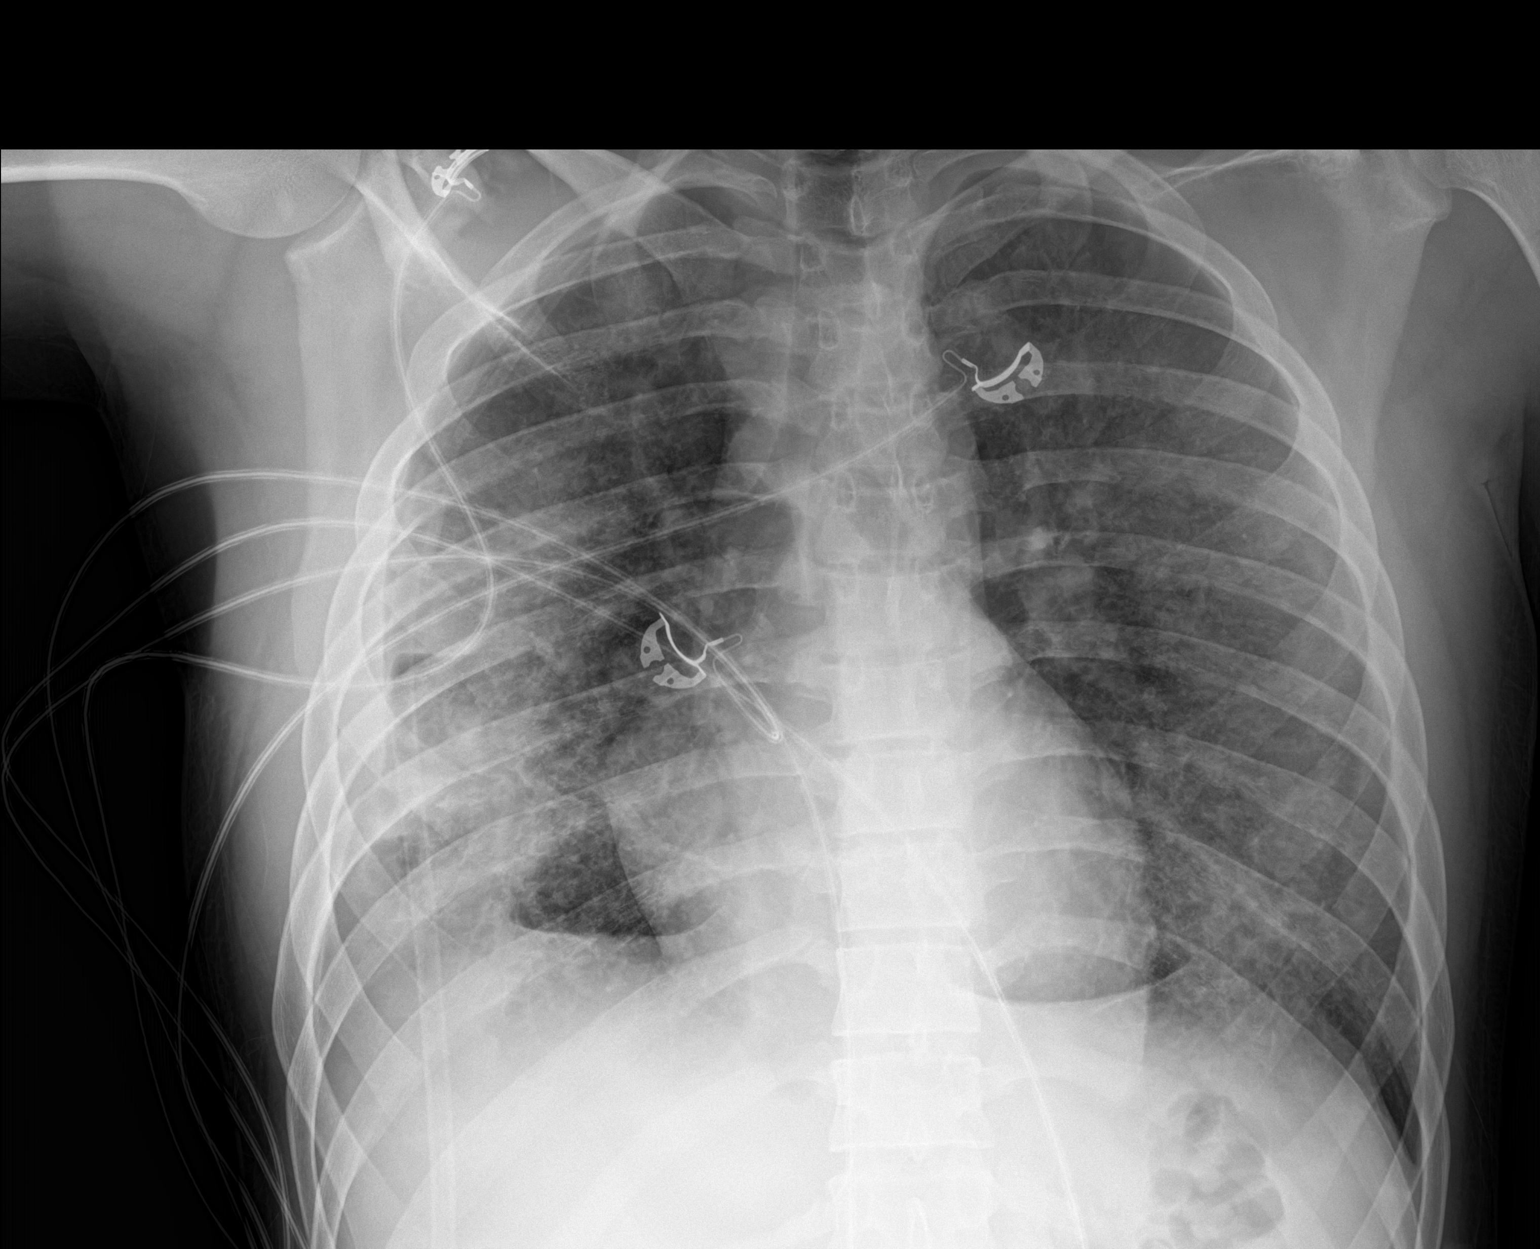

[1 of 1 positions shown; findings below may reference images not displayed]

FINDINGS: Cardiac shadow is stable. The lungs are well aerated bilaterally.
Diffuse bilateral infiltrative changes are identified now worse on
the right than the left in an almost reversal of infiltrates when
compared with the previous day. Right-sided pleural effusion is
noted.
IMPRESSION: Increasing infiltrate on the right in the mid and lower lung with
interval decrease in infiltrate on the left. Stable right effusion
is noted.

## 2019-01-24 IMAGING — DX DG CHEST 2V
2 series · 2 of 2 positions shown · non-contrast
Comparison: Multiple prior, most recent 11/13/2017.  CT 11/10/2017

CLINICAL DATA: 21-year-old male with a history of respiratory
failure

EXAM:
CHEST - 2 VIEW

[chest pa]
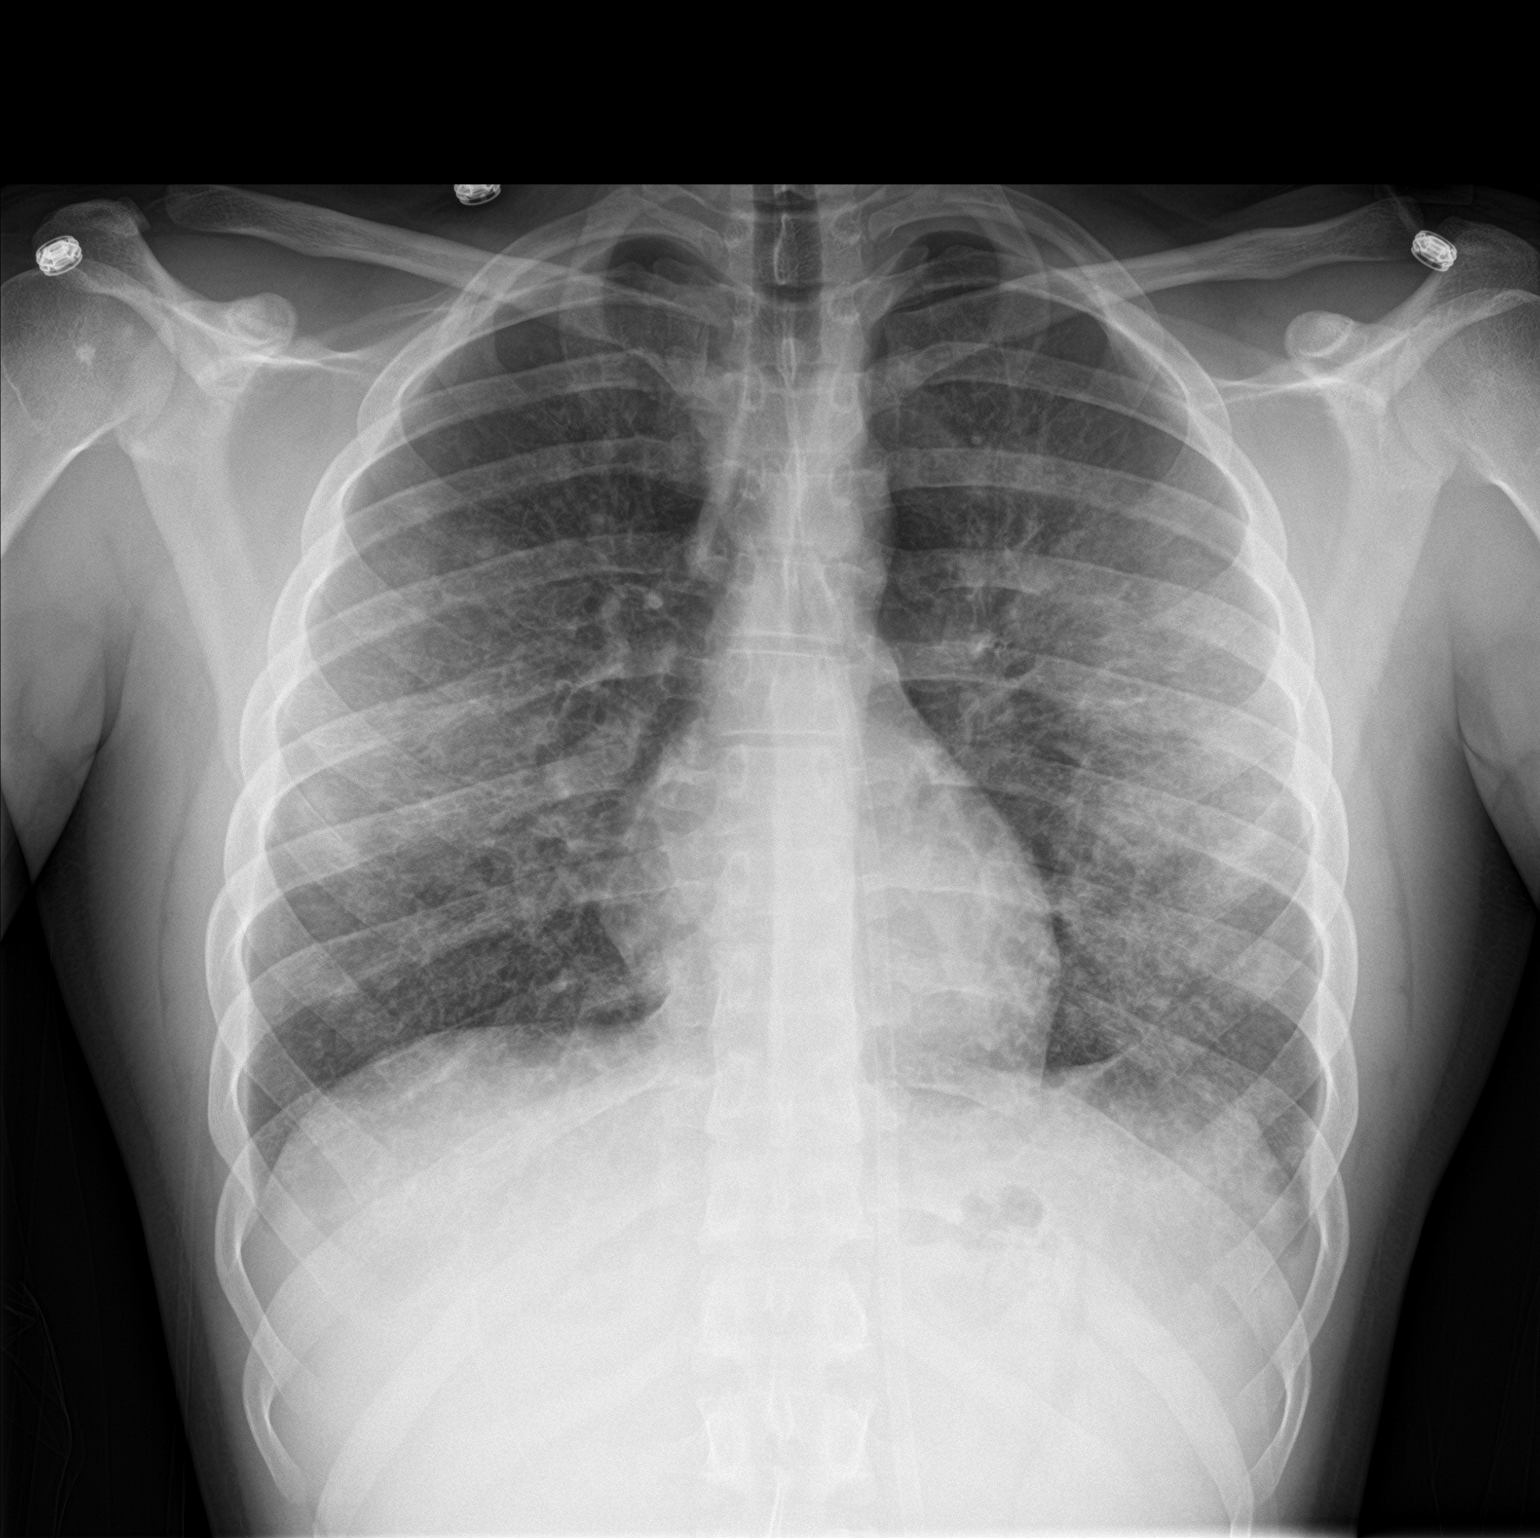

[chest lat]
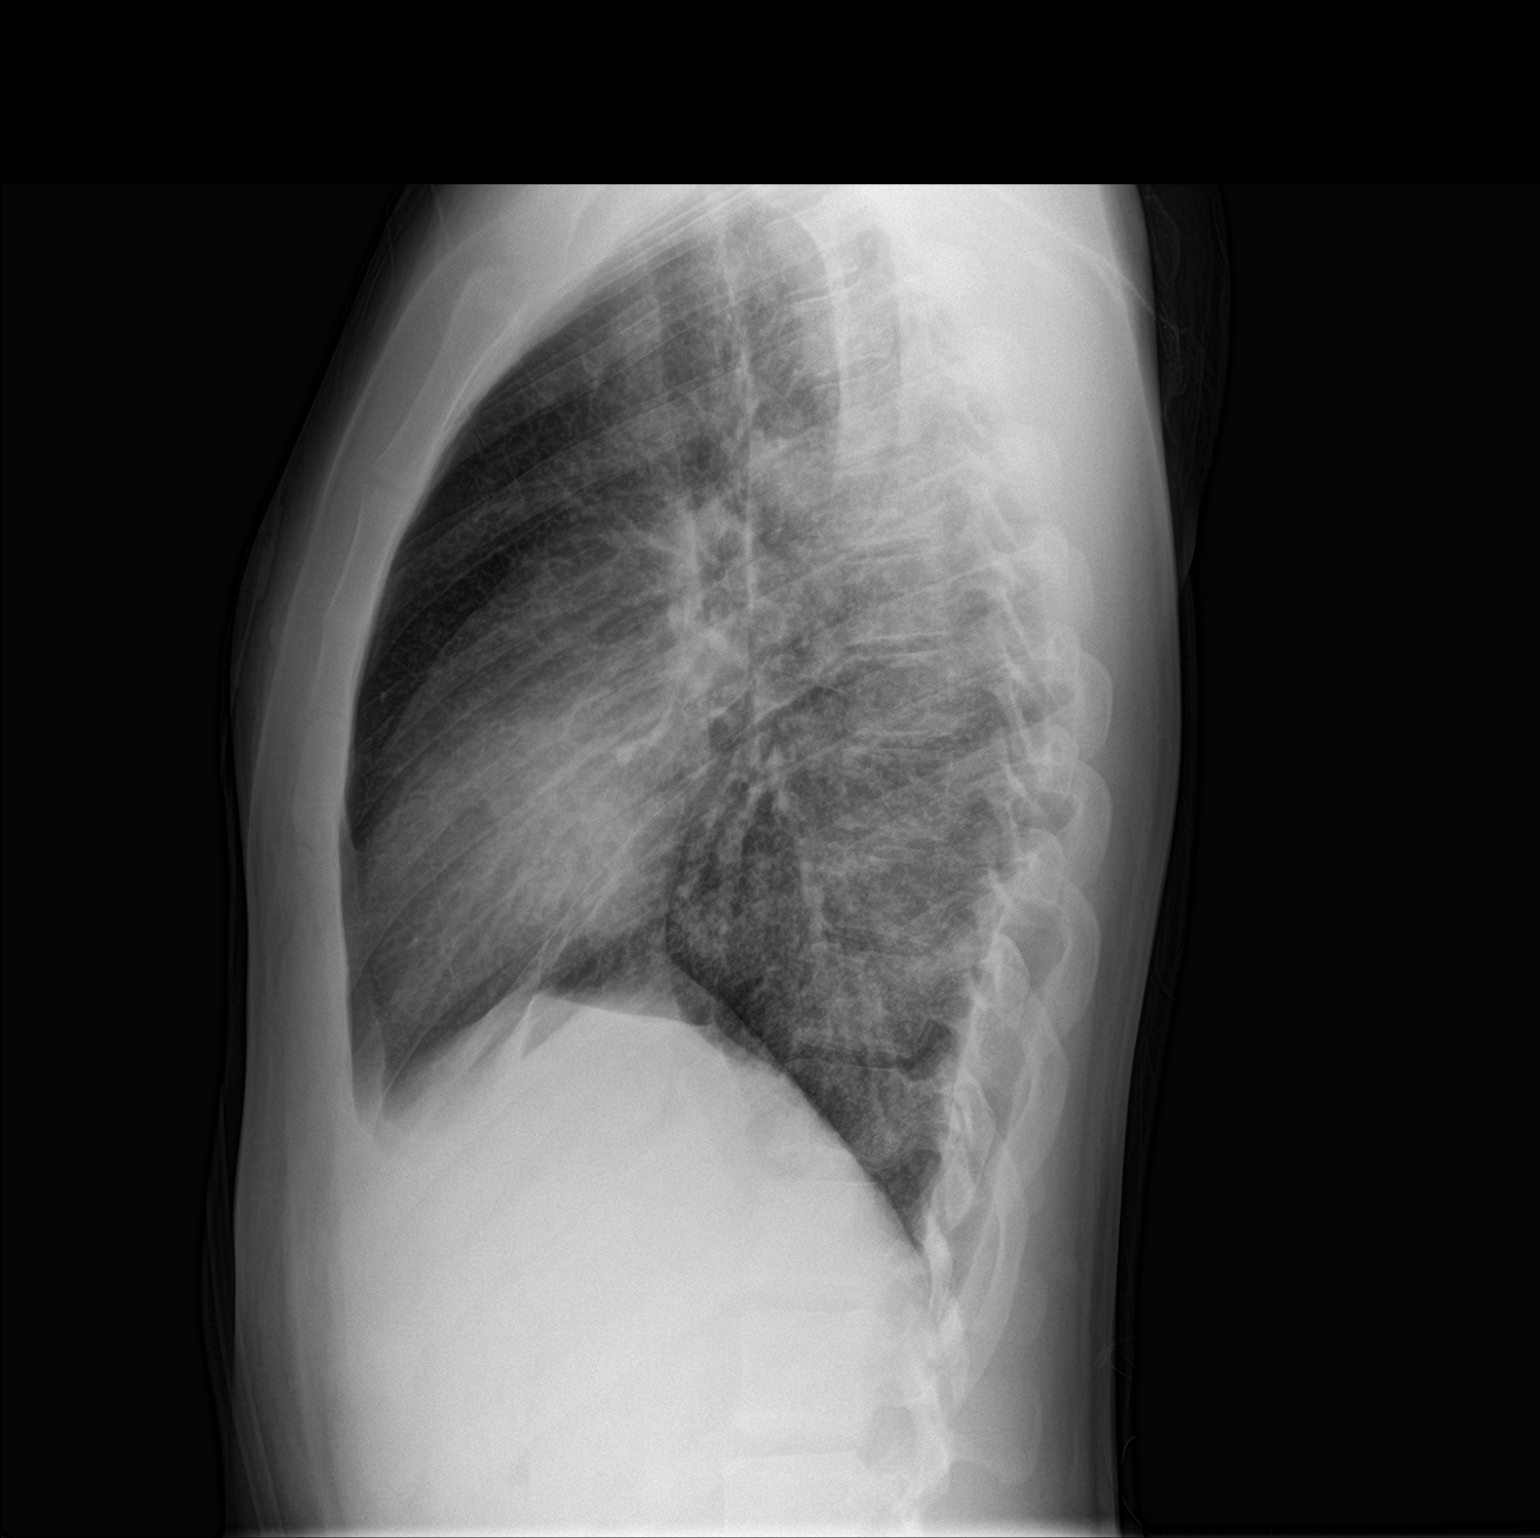

[2 of 2 positions shown; findings below may reference images not displayed]

FINDINGS: Cardiomediastinal silhouette unchanged in size and contour.

Persisting bilateral interstitial and airspace opacities in the mid
and lower lungs. Overall, aeration is improved from the comparison
plain film. No pleural effusion. No pneumothorax.
IMPRESSION: Improving bilateral airspace disease. As was previously noted on the
CT study, differential includes multifocal infection, atypical
infection/inflammation, as well as pneumonitis/toxic inhalation.

## 2019-02-08 DIAGNOSIS — F1290 Cannabis use, unspecified, uncomplicated: Secondary | ICD-10-CM | POA: Diagnosis not present

## 2019-02-08 DIAGNOSIS — F411 Generalized anxiety disorder: Secondary | ICD-10-CM | POA: Diagnosis not present

## 2019-02-08 DIAGNOSIS — F6381 Intermittent explosive disorder: Secondary | ICD-10-CM | POA: Diagnosis not present

## 2019-03-03 ENCOUNTER — Encounter: Payer: Self-pay | Admitting: Neurology

## 2019-03-04 DIAGNOSIS — F411 Generalized anxiety disorder: Secondary | ICD-10-CM | POA: Diagnosis not present

## 2019-03-04 DIAGNOSIS — F6381 Intermittent explosive disorder: Secondary | ICD-10-CM | POA: Diagnosis not present

## 2019-03-04 DIAGNOSIS — F1290 Cannabis use, unspecified, uncomplicated: Secondary | ICD-10-CM | POA: Diagnosis not present

## 2019-03-10 DIAGNOSIS — R748 Abnormal levels of other serum enzymes: Secondary | ICD-10-CM | POA: Diagnosis not present

## 2019-03-10 DIAGNOSIS — Z23 Encounter for immunization: Secondary | ICD-10-CM | POA: Diagnosis not present

## 2019-03-10 DIAGNOSIS — E875 Hyperkalemia: Secondary | ICD-10-CM | POA: Diagnosis not present

## 2019-03-15 DIAGNOSIS — Z1159 Encounter for screening for other viral diseases: Secondary | ICD-10-CM | POA: Diagnosis not present

## 2019-03-15 DIAGNOSIS — U071 COVID-19: Secondary | ICD-10-CM | POA: Diagnosis not present

## 2019-03-25 DIAGNOSIS — F6381 Intermittent explosive disorder: Secondary | ICD-10-CM | POA: Diagnosis not present

## 2019-03-25 DIAGNOSIS — F1290 Cannabis use, unspecified, uncomplicated: Secondary | ICD-10-CM | POA: Diagnosis not present

## 2019-03-25 DIAGNOSIS — F411 Generalized anxiety disorder: Secondary | ICD-10-CM | POA: Diagnosis not present

## 2019-05-05 DIAGNOSIS — F411 Generalized anxiety disorder: Secondary | ICD-10-CM | POA: Diagnosis not present

## 2019-05-05 DIAGNOSIS — F6381 Intermittent explosive disorder: Secondary | ICD-10-CM | POA: Diagnosis not present

## 2019-05-05 DIAGNOSIS — F1290 Cannabis use, unspecified, uncomplicated: Secondary | ICD-10-CM | POA: Diagnosis not present

## 2019-05-19 ENCOUNTER — Ambulatory Visit: Payer: BLUE CROSS/BLUE SHIELD | Admitting: Neurology

## 2019-06-10 DIAGNOSIS — F1290 Cannabis use, unspecified, uncomplicated: Secondary | ICD-10-CM | POA: Diagnosis not present

## 2019-06-10 DIAGNOSIS — F411 Generalized anxiety disorder: Secondary | ICD-10-CM | POA: Diagnosis not present

## 2019-06-10 DIAGNOSIS — F6381 Intermittent explosive disorder: Secondary | ICD-10-CM | POA: Diagnosis not present

## 2019-07-12 DIAGNOSIS — F1290 Cannabis use, unspecified, uncomplicated: Secondary | ICD-10-CM | POA: Diagnosis not present

## 2019-07-12 DIAGNOSIS — F6381 Intermittent explosive disorder: Secondary | ICD-10-CM | POA: Diagnosis not present

## 2019-07-12 DIAGNOSIS — F411 Generalized anxiety disorder: Secondary | ICD-10-CM | POA: Diagnosis not present

## 2019-07-28 DIAGNOSIS — L237 Allergic contact dermatitis due to plants, except food: Secondary | ICD-10-CM | POA: Diagnosis not present

## 2019-08-09 DIAGNOSIS — F411 Generalized anxiety disorder: Secondary | ICD-10-CM | POA: Diagnosis not present

## 2019-08-09 DIAGNOSIS — F1290 Cannabis use, unspecified, uncomplicated: Secondary | ICD-10-CM | POA: Diagnosis not present

## 2019-08-09 DIAGNOSIS — F6381 Intermittent explosive disorder: Secondary | ICD-10-CM | POA: Diagnosis not present

## 2019-09-02 DIAGNOSIS — F6381 Intermittent explosive disorder: Secondary | ICD-10-CM | POA: Diagnosis not present

## 2019-09-02 DIAGNOSIS — F1290 Cannabis use, unspecified, uncomplicated: Secondary | ICD-10-CM | POA: Diagnosis not present

## 2019-09-02 DIAGNOSIS — F411 Generalized anxiety disorder: Secondary | ICD-10-CM | POA: Diagnosis not present

## 2019-09-30 DIAGNOSIS — F411 Generalized anxiety disorder: Secondary | ICD-10-CM | POA: Diagnosis not present

## 2019-09-30 DIAGNOSIS — F6381 Intermittent explosive disorder: Secondary | ICD-10-CM | POA: Diagnosis not present

## 2019-09-30 DIAGNOSIS — F1290 Cannabis use, unspecified, uncomplicated: Secondary | ICD-10-CM | POA: Diagnosis not present

## 2019-11-08 DIAGNOSIS — F6381 Intermittent explosive disorder: Secondary | ICD-10-CM | POA: Diagnosis not present

## 2019-11-08 DIAGNOSIS — F1290 Cannabis use, unspecified, uncomplicated: Secondary | ICD-10-CM | POA: Diagnosis not present

## 2019-11-08 DIAGNOSIS — F411 Generalized anxiety disorder: Secondary | ICD-10-CM | POA: Diagnosis not present

## 2019-12-14 DIAGNOSIS — F6381 Intermittent explosive disorder: Secondary | ICD-10-CM | POA: Diagnosis not present

## 2019-12-14 DIAGNOSIS — F1290 Cannabis use, unspecified, uncomplicated: Secondary | ICD-10-CM | POA: Diagnosis not present

## 2019-12-14 DIAGNOSIS — F411 Generalized anxiety disorder: Secondary | ICD-10-CM | POA: Diagnosis not present

## 2020-01-03 DIAGNOSIS — B360 Pityriasis versicolor: Secondary | ICD-10-CM | POA: Diagnosis not present

## 2020-01-04 DIAGNOSIS — F411 Generalized anxiety disorder: Secondary | ICD-10-CM | POA: Diagnosis not present

## 2020-01-04 DIAGNOSIS — F6381 Intermittent explosive disorder: Secondary | ICD-10-CM | POA: Diagnosis not present

## 2020-01-04 DIAGNOSIS — F1290 Cannabis use, unspecified, uncomplicated: Secondary | ICD-10-CM | POA: Diagnosis not present

## 2020-01-20 DIAGNOSIS — F6381 Intermittent explosive disorder: Secondary | ICD-10-CM | POA: Diagnosis not present

## 2020-01-20 DIAGNOSIS — F411 Generalized anxiety disorder: Secondary | ICD-10-CM | POA: Diagnosis not present

## 2020-01-20 DIAGNOSIS — F1290 Cannabis use, unspecified, uncomplicated: Secondary | ICD-10-CM | POA: Diagnosis not present

## 2020-04-06 DIAGNOSIS — F6381 Intermittent explosive disorder: Secondary | ICD-10-CM | POA: Diagnosis not present

## 2020-04-06 DIAGNOSIS — F1290 Cannabis use, unspecified, uncomplicated: Secondary | ICD-10-CM | POA: Diagnosis not present

## 2020-04-06 DIAGNOSIS — F411 Generalized anxiety disorder: Secondary | ICD-10-CM | POA: Diagnosis not present

## 2020-05-04 DIAGNOSIS — F1290 Cannabis use, unspecified, uncomplicated: Secondary | ICD-10-CM | POA: Diagnosis not present

## 2020-05-04 DIAGNOSIS — F411 Generalized anxiety disorder: Secondary | ICD-10-CM | POA: Diagnosis not present

## 2020-05-04 DIAGNOSIS — F6381 Intermittent explosive disorder: Secondary | ICD-10-CM | POA: Diagnosis not present

## 2020-06-21 DIAGNOSIS — F411 Generalized anxiety disorder: Secondary | ICD-10-CM | POA: Diagnosis not present

## 2020-06-21 DIAGNOSIS — F6381 Intermittent explosive disorder: Secondary | ICD-10-CM | POA: Diagnosis not present

## 2020-06-21 DIAGNOSIS — F1290 Cannabis use, unspecified, uncomplicated: Secondary | ICD-10-CM | POA: Diagnosis not present

## 2020-07-27 DIAGNOSIS — F1290 Cannabis use, unspecified, uncomplicated: Secondary | ICD-10-CM | POA: Diagnosis not present

## 2020-07-27 DIAGNOSIS — F411 Generalized anxiety disorder: Secondary | ICD-10-CM | POA: Diagnosis not present

## 2020-07-27 DIAGNOSIS — F6381 Intermittent explosive disorder: Secondary | ICD-10-CM | POA: Diagnosis not present

## 2020-10-26 DIAGNOSIS — F1290 Cannabis use, unspecified, uncomplicated: Secondary | ICD-10-CM | POA: Diagnosis not present

## 2020-10-26 DIAGNOSIS — F6381 Intermittent explosive disorder: Secondary | ICD-10-CM | POA: Diagnosis not present

## 2020-10-26 DIAGNOSIS — F411 Generalized anxiety disorder: Secondary | ICD-10-CM | POA: Diagnosis not present

## 2020-12-21 DIAGNOSIS — F6381 Intermittent explosive disorder: Secondary | ICD-10-CM | POA: Diagnosis not present

## 2020-12-21 DIAGNOSIS — F411 Generalized anxiety disorder: Secondary | ICD-10-CM | POA: Diagnosis not present

## 2020-12-21 DIAGNOSIS — F1290 Cannabis use, unspecified, uncomplicated: Secondary | ICD-10-CM | POA: Diagnosis not present

## 2021-01-30 DIAGNOSIS — F411 Generalized anxiety disorder: Secondary | ICD-10-CM | POA: Diagnosis not present

## 2021-01-30 DIAGNOSIS — F1290 Cannabis use, unspecified, uncomplicated: Secondary | ICD-10-CM | POA: Diagnosis not present

## 2021-01-30 DIAGNOSIS — F6381 Intermittent explosive disorder: Secondary | ICD-10-CM | POA: Diagnosis not present

## 2021-03-09 DIAGNOSIS — R002 Palpitations: Secondary | ICD-10-CM | POA: Diagnosis not present

## 2021-03-29 ENCOUNTER — Encounter: Payer: Self-pay | Admitting: Cardiology

## 2021-03-29 ENCOUNTER — Inpatient Hospital Stay: Payer: BC Managed Care – PPO

## 2021-03-29 ENCOUNTER — Ambulatory Visit: Payer: BC Managed Care – PPO | Admitting: Cardiology

## 2021-03-29 ENCOUNTER — Other Ambulatory Visit: Payer: Self-pay

## 2021-03-29 VITALS — BP 125/72 | HR 82 | Temp 98.0°F | Resp 16 | Ht 66.0 in | Wt 143.0 lb

## 2021-03-29 DIAGNOSIS — F1290 Cannabis use, unspecified, uncomplicated: Secondary | ICD-10-CM

## 2021-03-29 DIAGNOSIS — F32A Depression, unspecified: Secondary | ICD-10-CM

## 2021-03-29 DIAGNOSIS — R55 Syncope and collapse: Secondary | ICD-10-CM

## 2021-03-29 DIAGNOSIS — F419 Anxiety disorder, unspecified: Secondary | ICD-10-CM | POA: Diagnosis not present

## 2021-03-29 NOTE — Progress Notes (Signed)
Date:  03/29/2021   ID:  Zachary Gill, DOB [birth year], MRN [id number]  PCP:  Fatima Sanger, MD  Cardiologist:  Tessa Lerner, DO, [facility] (established care 03/29/2021)  REASON FOR CONSULT: Syncope  REQUESTING PHYSICIAN:  [place] [address] [place],  [place] [address]  Chief Complaint  Patient presents with   New Patient (Initial Visit)    Syncope    HPI  Zachary Gill is a 26 y.o. Caucasian male who presents to the office with a chief complaint of " syncope." Patient's past medical history and cardiovascular risk factors include: Anxiety, depression, history of vaping, marijuana use.  He is referred to the office at the request of Fatima Sanger, MD for evaluation of syncope.  Patient is accompanied by his mother and he provides verbal consent with regards to having her present during his office visit.  According to the patient's mother she had an uneventful pregnancy at the time of his birth and no known congenital heart disease.  He did follow-up with pediatric cardiologist during his seventh/eighth grade due to palpitations.  He underwent an echocardiogram and exercise treadmill stress test which were unremarkable.  At that time his symptoms were attributed to anxiety.  While in school patient played baseball, soccer, track, football and did not experience any exertional syncope growing up.  He was hospitalized in 2019 due to vaping injury to the lung.  According to the patient's mother "he was smoking wax," and the patient states that 2019 hospitalization was secondary to COVID-19 infection.  Patient is referred to the practice for evaluation of syncope.  Approximately 1 month ago in November/December 2022 he had 1 syncopal event followed by a near syncope event a week later.  Patient states that he recalls drinking eggnog and felt that it went the wrong way and subsequently had vision, diaphoresis, felt like he would pass out and so he would stood up and  walked to the couch.  Prior to getting to the couch he passed out on the floor.  The event was witnessed by his mom who recalls loss of consciousness to be no longer than a few seconds.  No loss of bowel or bladder function, no tongue biting, no history of seizures.  When he regained consciousness he could recall the events.  Patient did not go to the ER or urgent care for further evaluation.  The following week patient states that after a long day at work he went to the gym and when he got up to pick up the barbell he had an episode of near syncope but he did not lose consciousness.  Prior to his episode in December 2022 patient states that he has been smoking " a lot of marijuana."  He used to smoke at least 8-10 times per day (quantifying it into grams).  He has stopped smoking marijuana for the last 2 or 3 weeks and has not had any reoccurrence of palpitations or syncope.  Patient denies the use of excessive coffee consumption, soda consumption, stimulants, herbal supplements, weight loss supplements, no known thyroid disease or history of anemia.  No family history of premature coronary artery disease, syncope, or cardiomyopathy.  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    ALLERGIES: No Known Allergies  MEDICATION LIST PRIOR TO VISIT: No outpatient medications have been marked as taking for the 03/29/21 encounter (Office Visit) with Odis Hollingshead, Ava Tangney, DO.     PAST MEDICAL HISTORY: Past Medical History:  Diagnosis Date   Vitiligo     PAST SURGICAL HISTORY: Past Surgical History:  Procedure Laterality Date   LAPAROSCOPIC APPENDECTOMY N/A 08/01/2015   Procedure: APPENDECTOMY LAPAROSCOPIC;  Surgeon: Gerald Stabs, MD;  Location: [place];  Service: Pediatrics;  Laterality: N/A;   LAPAROSCOPY N/A 11/08/2017   Procedure: LAPAROSCOPY DIAGNOSTIC;  Surgeon: Judeth Horn, MD;  Location: [place];  Service: General;  Laterality: N/A;    FAMILY HISTORY: The  patient family history includes Healthy in his brother, father, and mother.  SOCIAL HISTORY:  The patient  reports that he has never smoked. He has never used smokeless tobacco. He reports current alcohol use. He reports current drug use. Drug: Marijuana.  REVIEW OF SYSTEMS: Review of Systems  Constitutional: Negative for chills and fever.  HENT:  Negative for hoarse voice and nosebleeds.   Eyes:  Negative for discharge, double vision and pain.  Cardiovascular:  Positive for near-syncope and syncope. Negative for chest pain, claudication, dyspnea on exertion, leg swelling, orthopnea, palpitations and paroxysmal nocturnal dyspnea.  Respiratory:  Negative for hemoptysis and shortness of breath.   Musculoskeletal:  Negative for muscle cramps and myalgias.  Gastrointestinal:  Negative for abdominal pain, constipation, diarrhea, hematemesis, hematochezia, melena, nausea and vomiting.  Neurological:  Negative for dizziness and light-headedness.   PHYSICAL EXAM: Vitals with BMI 03/29/2021 11/26/2017 11/14/2017  Height 5\' 6"  5\' 6"  -  Weight 143 lbs 152 lbs 10 oz -  BMI 123456 123456 -  Systolic 0000000 [id number] -  Diastolic 72 78 -  Pulse 82 92 56   Orthostatic VS for the past 72 hrs (Last 3 readings):  Orthostatic BP Patient Position BP Location Cuff Size Orthostatic Pulse  03/29/21 1051 124/69 Standing Left Arm Normal 86  03/29/21 1050 123/69 Sitting Left Arm Normal 78  03/29/21 1049 120/64 Supine Left Arm Normal 80    CONSTITUTIONAL: Well-developed and well-nourished. No acute distress.  SKIN: Skin is warm and dry. No rash noted. No cyanosis. No pallor. No jaundice.  Vitiligo HEAD: Normocephalic and atraumatic.  EYES: No scleral icterus MOUTH/THROAT: Moist oral membranes.  NECK: No JVD present. No thyromegaly noted. No carotid bruits  LYMPHATIC: No visible cervical adenopathy.  CHEST Normal respiratory effort. No intercostal retractions  LUNGS: Clear to auscultation bilaterally.  No stridor. No  wheezes. No rales.  CARDIOVASCULAR: Regular rate and rhythm, positive S1-S2, no murmurs rubs or gallops appreciated. ABDOMINAL: Soft, nontender, nondistended, positive bowel sounds in all 4 quadrants, no apparent ascites.  EXTREMITIES: No peripheral edema, warm to touch, 2+ bilateral DP and PT pulses HEMATOLOGIC: No significant bruising NEUROLOGIC: Oriented to person, place, and time. Nonfocal. Normal muscle tone.  PSYCHIATRIC: Normal mood and affect. Normal behavior. Cooperative  CARDIAC DATABASE: EKG: 03/29/2021: Normal sinus rhythm, 74 bpm, RSR(V1) -probably normal for age, without underlying ischemia or injury pattern.  Echocardiogram: 07/16/2010 [facility], care everywhere: [place]. Abdominal situs solitus. Atrial situs solitus. D Ventricular Loop. S Normal    position great vessels.   VEINS    Normal systemic venous connections. At least three pulmonary veins are confirmed    connecting into the left atrium. The right upper pulmonary vein is not seen. Normal    pulmonary vein velocity.   ATRIA    Normal right atrial size. Normal left atrial size. The atrial septum is not well seen in    this study, cannot rule out small defects or a patent foramen ovale.   ATRIOVENTRICULAR VALVES    Normal tricuspid valve.  Normal tricuspid valve inflow velocity. Trace tricuspid valve    insufficiency. The TR peak gradient is at least 19 mmHg, but is obtained from an    incomplete tracing. Normal mitral valve. Normal mitral valve inflow velocity. Trace    mitral valve insufficiency.   VENTRICLES    Normal right ventricle structure and size. Normal left ventricle structure and size.    Left Ventricle false tendon. The ventricular septum is not optimally visualized, but no    ventricular septal defect is seen. Normal septal motion consistent with normal right    ventricular pressures.   CARDIAC FUNCTION    Normal right ventricular systolic function. Normal left  ventricular systolic function.    Septal MV annuluar TDI E/e' ratio = 7.7 (NL = <10.0).   SEMILUNAR VALVES    Normal pulmonic valve. Normal pulmonic valve velocity. Trivial pulmonary valve    insufficiency. Aortic valve mobility appears normal. Normal aortic valve velocity by    Doppler. No aortic valve insufficiency by color Doppler.   CORONARY ARTERIES    Normal origin and proximal course of the right coronary artery with prograde flow    demonstrated by color Doppler. Normal origin and proximal course of the left coronary    artery with prograde flow demonstrated by color Doppler.   GREAT ARTERIES    Left aortic arch with normal branching pattern. No evidence of coarctation of the aorta.    Normal pulmonary artery branches.   EXTRACARDIAC    No pericardial effusion. There is no pleural effusion.    Stress Testing: 08/10/2010 [facility], available in Care Everywhere: Conclusion:  1. Negative adequate stress test achieving peak HR of 203 bpm (97% of predicted) with no ECG changes or arrhythmias  2. Normal Heart rate and blood pressure response   Heart Catheterization: None  LABORATORY DATA: CBC Latest Ref Rng & Units 11/14/2017 11/13/2017 11/12/2017  WBC 4.0 - 10.5 K/uL 13.0(H) 16.7(H) 17.5(H)  Hemoglobin 13.0 - 17.0 g/dL 13.8 12.3(L) 11.9(L)  Hematocrit 39.0 - 52.0 % 42.5 35.8(L) 34.8(L)  Platelets 150 - 400 K/uL 438(H) 359 284    CMP Latest Ref Rng & Units 11/13/2017 11/12/2017 11/11/2017  Glucose 70 - 99 mg/dL 135(H) 130(H) 108(H)  BUN 6 - 20 mg/dL 18 11 7   Creatinine 0.61 - 1.24 mg/dL 0.74 0.77 0.78  Sodium 135 - 145 mmol/L 141 140 140  Potassium 3.5 - 5.1 mmol/L 4.4 4.0 4.1  Chloride 98 - 111 mmol/L 102 101 105  CO2 22 - 32 mmol/L 29 29 27   Calcium 8.9 - 10.3 mg/dL 9.0 9.1 8.4(L)  Total Protein 6.5 - 8.1 g/dL - - 5.3(L)  Total Bilirubin 0.3 - 1.2 mg/dL - - 0.6  Alkaline Phos 38 - 126 U/L - - 82  AST 15 - 41 U/L - - 26  ALT 0 - 44 U/L - - 17    Lipid Panel   No results found for: CHOL, TRIG, HDL, CHOLHDL, VLDL, LDLCALC, LDLDIRECT, LABVLDL  No components found for: NTPROBNP No results for input(s): PROBNP in the last 8760 hours. No results for input(s): TSH in the last 8760 hours.  BMP No results for input(s): NA, K, CL, CO2, GLUCOSE, BUN, CREATININE, CALCIUM, GFRNONAA, GFRAA in the last 8760 hours.  HEMOGLOBIN A1C No results found for: HGBA1C, MPG  IMPRESSION:    ICD-10-CM   1. Syncope and collapse  R55 EKG 12-Lead    PCV ECHOCARDIOGRAM COMPLETE    PCV CARDIAC STRESS TEST    LONG TERM  MONITOR (3-14 DAYS)    Hemoglobin and hematocrit, blood    Basic metabolic panel    TSH    2. Marijuana use  F12.90     3. Anxiety  F41.9     4. Depression, unspecified depression type  F32.A        RECOMMENDATIONS: Glade San is a 26 y.o. Caucasian male whose past medical history and cardiac risk factors include:  Anxiety, depression, history of vaping, marijuana use.  Syncope and collapse Syncope back in December 2023 most likely secondary to vasovagal and due to excessive marijuana use No reoccurrence of symptoms after the cessation of marijuana. EKG: Normal sinus rhythm. Orthostatic vital signs negative. Check BMP, TSH, H&H 14-day extended Holter monitor to evaluate for dysrhythmias. Echocardiogram will be ordered to evaluate for structural heart disease and left ventricular systolic function. Exercise treadmill stress test to evaluate for functional status and exercise-induced arrhythmia Educated on importance of complete cessation of marijuana. No other identifiable reversible causes. Because a true cause of his recent syncopal event is unknown patient is advised not to drive or operate machinery given his recent syncopal event for 6 months.  His and his mom's questions and concerns were addressed to their satisfaction.   Marijuana use Educated importance of continued cessation of marijuana products  Anxiety I have asked him  to discuss this further with PCP and consider pharmacological therapy if clinically warranted.  FINAL MEDICATION LIST END OF ENCOUNTER: No orders of the defined types were placed in this encounter.   Medications Discontinued During This Encounter  Medication Reason   acetaminophen (TYLENOL) 500 MG tablet    FLUoxetine (PROZAC) 20 MG capsule    predniSONE (DELTASONE) 10 MG tablet    tacrolimus (PROTOPIC) 0.1 % ointment     No current outpatient medications on file.  Orders Placed This Encounter  Procedures   Hemoglobin and hematocrit, blood   Basic metabolic panel   TSH   PCV CARDIAC STRESS TEST   LONG TERM MONITOR (3-14 DAYS)   EKG 12-Lead   PCV ECHOCARDIOGRAM COMPLETE    There are no Patient Instructions on file for this visit.   --Continue cardiac medications as reconciled in final medication list. --Return in about 6 weeks (around 05/10/2021) for Reevaluation after recent syncope, review test results. Or sooner if needed. --Continue follow-up with your primary care physician regarding the management of your other chronic comorbid conditions.  Patient's questions and concerns were addressed to his satisfaction. He voices understanding of the instructions provided during this encounter.   This note was created using a voice recognition software as a result there may be grammatical errors inadvertently enclosed that do not reflect the nature of this encounter. Every attempt is made to correct such errors.  As part of this initial consultation reviewed outside records provided by PCP via [facility] included office note the findings have been summarized and noted above for further reference.  Discussed disease management, ordering diagnostic testing, coordination of care and patient education provided as a part of today's encounter.   Rex Kras, [place], [facility]  Pager: [phone] Office: [phone]

## 2021-04-16 DIAGNOSIS — R55 Syncope and collapse: Secondary | ICD-10-CM | POA: Diagnosis not present

## 2021-04-27 ENCOUNTER — Telehealth: Payer: Self-pay | Admitting: Cardiology

## 2021-04-27 NOTE — Telephone Encounter (Signed)
Called to reschedule follow up with patient since you will not be in office on 05/14/21. Informed patient he still needs to schedule echo and stress test as well. Patient stated he is ok with follow up, but doesn't want to complete the echo and stress test.

## 2021-04-29 DIAGNOSIS — R55 Syncope and collapse: Secondary | ICD-10-CM | POA: Diagnosis not present

## 2021-04-30 NOTE — Progress Notes (Signed)
Pt aware and confirmed upcoming appt date and time//ah

## 2021-04-30 NOTE — Progress Notes (Signed)
Attempted to call pt, no answer. Left vm requesting call back.

## 2021-05-09 DIAGNOSIS — F411 Generalized anxiety disorder: Secondary | ICD-10-CM | POA: Diagnosis not present

## 2021-05-09 DIAGNOSIS — F1290 Cannabis use, unspecified, uncomplicated: Secondary | ICD-10-CM | POA: Diagnosis not present

## 2021-05-09 DIAGNOSIS — F6381 Intermittent explosive disorder: Secondary | ICD-10-CM | POA: Diagnosis not present

## 2021-05-14 ENCOUNTER — Ambulatory Visit: Payer: BC Managed Care – PPO | Admitting: Cardiology

## 2021-05-16 ENCOUNTER — Telehealth: Payer: Self-pay

## 2021-05-17 ENCOUNTER — Ambulatory Visit: Payer: BC Managed Care – PPO | Admitting: Cardiology

## 2021-05-17 NOTE — Telephone Encounter (Signed)
Blood work was ordered as part of his syncope work-up.  Which I would recommend just like the echo and GXT. ? ?Monitor results essentially unremarkable.  Average heart rate 78 bpm.  No significant dysrhythmias.  Based on the documentation the office reached out to him in February 2023. ? ?Hope this answers his questions. ? ?Dr. Terri Skains

## 2021-05-31 NOTE — Telephone Encounter (Signed)
Spoke to patient he is aware of results and patient is scheduled to get test done stress and echo

## 2021-06-01 ENCOUNTER — Ambulatory Visit: Payer: BC Managed Care – PPO

## 2021-06-01 ENCOUNTER — Other Ambulatory Visit: Payer: Self-pay

## 2021-06-01 DIAGNOSIS — R55 Syncope and collapse: Secondary | ICD-10-CM

## 2021-06-04 ENCOUNTER — Telehealth: Payer: Self-pay | Admitting: Cardiology

## 2021-06-04 NOTE — Progress Notes (Signed)
Called and spoke with patient regarding his stress test results.  ? ?Call was transferred to [facility] ([id number]) to scheduled appointment for after testing is complete.

## 2021-06-04 NOTE — Telephone Encounter (Signed)
Left voicemail with patient to schedule follow up appointment after all testing completed.  ?

## 2021-06-06 ENCOUNTER — Other Ambulatory Visit: Payer: Self-pay

## 2021-06-06 ENCOUNTER — Ambulatory Visit: Payer: BC Managed Care – PPO

## 2021-06-06 DIAGNOSIS — R55 Syncope and collapse: Secondary | ICD-10-CM | POA: Diagnosis not present

## 2021-06-19 DIAGNOSIS — Z79891 Long term (current) use of opiate analgesic: Secondary | ICD-10-CM | POA: Diagnosis not present

## 2021-06-19 DIAGNOSIS — F411 Generalized anxiety disorder: Secondary | ICD-10-CM | POA: Diagnosis not present

## 2021-06-19 DIAGNOSIS — F6381 Intermittent explosive disorder: Secondary | ICD-10-CM | POA: Diagnosis not present

## 2021-06-19 NOTE — Telephone Encounter (Signed)
Sure.  ? ?Thanks for the update.  ? ?Tessa Lerner, DO, FACC ?

## 2021-06-19 NOTE — Telephone Encounter (Signed)
Called patient, he said he does not feel like he needs to follow up and will only come in as needed.

## 2021-07-19 DIAGNOSIS — F6381 Intermittent explosive disorder: Secondary | ICD-10-CM | POA: Diagnosis not present

## 2021-07-19 DIAGNOSIS — F411 Generalized anxiety disorder: Secondary | ICD-10-CM | POA: Diagnosis not present

## 2021-08-27 DIAGNOSIS — F6381 Intermittent explosive disorder: Secondary | ICD-10-CM | POA: Diagnosis not present

## 2021-08-27 DIAGNOSIS — F411 Generalized anxiety disorder: Secondary | ICD-10-CM | POA: Diagnosis not present

## 2021-11-26 DIAGNOSIS — F6381 Intermittent explosive disorder: Secondary | ICD-10-CM | POA: Diagnosis not present

## 2021-11-26 DIAGNOSIS — F411 Generalized anxiety disorder: Secondary | ICD-10-CM | POA: Diagnosis not present

## 2022-01-19 DIAGNOSIS — R591 Generalized enlarged lymph nodes: Secondary | ICD-10-CM | POA: Diagnosis not present

## 2022-01-21 DIAGNOSIS — R590 Localized enlarged lymph nodes: Secondary | ICD-10-CM | POA: Diagnosis not present

## 2022-02-25 DIAGNOSIS — F411 Generalized anxiety disorder: Secondary | ICD-10-CM | POA: Diagnosis not present

## 2022-02-25 DIAGNOSIS — F6381 Intermittent explosive disorder: Secondary | ICD-10-CM | POA: Diagnosis not present

## 2022-09-05 DIAGNOSIS — F6381 Intermittent explosive disorder: Secondary | ICD-10-CM | POA: Diagnosis not present

## 2022-09-05 DIAGNOSIS — F411 Generalized anxiety disorder: Secondary | ICD-10-CM | POA: Diagnosis not present

## 2022-09-11 DIAGNOSIS — L255 Unspecified contact dermatitis due to plants, except food: Secondary | ICD-10-CM | POA: Diagnosis not present

## 2022-09-11 DIAGNOSIS — L80 Vitiligo: Secondary | ICD-10-CM | POA: Diagnosis not present

## 2023-02-18 DIAGNOSIS — R55 Syncope and collapse: Secondary | ICD-10-CM | POA: Diagnosis not present

## 2023-02-18 DIAGNOSIS — R112 Nausea with vomiting, unspecified: Secondary | ICD-10-CM | POA: Diagnosis not present

## 2023-02-18 DIAGNOSIS — R197 Diarrhea, unspecified: Secondary | ICD-10-CM | POA: Diagnosis not present

## 2023-02-19 ENCOUNTER — Other Ambulatory Visit: Payer: Self-pay

## 2023-02-19 ENCOUNTER — Emergency Department (HOSPITAL_BASED_OUTPATIENT_CLINIC_OR_DEPARTMENT_OTHER)
Admission: EM | Admit: 2023-02-19 | Discharge: 2023-02-19 | Disposition: A | Discharge status: Home / Self Care | Payer: BC Managed Care – PPO | Attending: Emergency Medicine | Admitting: Emergency Medicine

## 2023-02-19 ENCOUNTER — Encounter (HOSPITAL_BASED_OUTPATIENT_CLINIC_OR_DEPARTMENT_OTHER): Payer: Self-pay | Admitting: Emergency Medicine

## 2023-02-19 DIAGNOSIS — R55 Syncope and collapse: Secondary | ICD-10-CM | POA: Diagnosis not present

## 2023-02-19 HISTORY — DX: Anxiety disorder, unspecified: F41.9

## 2023-02-19 LAB — CBC WITH DIFFERENTIAL/PLATELET
Abs Immature Granulocytes: 0.03 10*3/uL (ref 0.00–0.07)
Basophils Absolute: 0 10*3/uL (ref 0.0–0.1)
Basophils Relative: 1 %
Eosinophils Absolute: 0.1 10*3/uL (ref 0.0–0.5)
Eosinophils Relative: 2 %
HCT: 43.8 % (ref 39.0–52.0)
Hemoglobin: 15.7 g/dL (ref 13.0–17.0)
Immature Granulocytes: 1 %
Lymphocytes Relative: 41 %
Lymphs Abs: 2.7 10*3/uL (ref 0.7–4.0)
MCH: 33.8 pg (ref 26.0–34.0)
MCHC: 35.8 g/dL (ref 30.0–36.0)
MCV: 94.4 fL (ref 80.0–100.0)
Monocytes Absolute: 0.5 10*3/uL (ref 0.1–1.0)
Monocytes Relative: 8 %
Neutro Abs: 3.2 10*3/uL (ref 1.7–7.7)
Neutrophils Relative %: 47 %
Platelets: 152 10*3/uL (ref 150–400)
RBC: 4.64 MIL/uL (ref 4.22–5.81)
RDW: 12.8 % (ref 11.5–15.5)
WBC: 6.6 10*3/uL (ref 4.0–10.5)
nRBC: 0 % (ref 0.0–0.2)

## 2023-02-19 LAB — RAPID URINE DRUG SCREEN, HOSP PERFORMED
Amphetamines: NOT DETECTED
Barbiturates: NOT DETECTED
Benzodiazepines: NOT DETECTED
Cocaine: NOT DETECTED
Opiates: NOT DETECTED
Tetrahydrocannabinol: POSITIVE — AB

## 2023-02-19 LAB — COMPREHENSIVE METABOLIC PANEL
ALT: 39 U/L (ref 0–44)
AST: 24 U/L (ref 15–41)
Albumin: 4.5 g/dL (ref 3.5–5.0)
Alkaline Phosphatase: 120 U/L (ref 38–126)
Anion gap: 8 (ref 5–15)
BUN: 18 mg/dL (ref 6–20)
CO2: 27 mmol/L (ref 22–32)
Calcium: 9.3 mg/dL (ref 8.9–10.3)
Chloride: 106 mmol/L (ref 98–111)
Creatinine, Ser: 0.94 mg/dL (ref 0.61–1.24)
GFR, Estimated: >60 mL/min (ref >60)
Glucose, Bld: 102 mg/dL — ABNORMAL HIGH (ref 70–99)
Potassium: 4.7 mmol/L (ref 3.5–5.1)
Sodium: 141 mmol/L (ref 135–145)
Total Bilirubin: 0.6 mg/dL (ref <1.2)
Total Protein: 6.7 g/dL (ref 6.5–8.1)

## 2023-02-19 LAB — MAGNESIUM: Magnesium: 1.9 mg/dL (ref 1.7–2.4)

## 2023-02-19 NOTE — ED Provider Notes (Signed)
[place] EMERGENCY DEPARTMENT AT [facility] Provider Note   CSN: [id number] Arrival date & time: 02/19/23  7829     History  Chief Complaint  Patient presents with   Loss of Consciousness    Zachary Gill is a 27 y.o. male.  This is a 27 year old male presents emergency department after an episode yesterday where he lost consciousness.  Reportedly, patient felt lightheaded, nauseated, and then he lost consciousness.  His friend who is with him says that he shook for about 10 to 15 seconds.  He did not have a postictal period.  He did not have any tongue biting, bowel or bladder incontinence.  Patient says it is episodes like this once per year.  He says that he wore Holter monitor much earlier in his life.  No family history of sudden cardiac death.   Loss of Consciousness      Home Medications Prior to Admission medications   Medication Sig Start Date End Date Taking? Authorizing Provider  ondansetron (ZOFRAN-ODT) 8 MG disintegrating tablet Take 8 mg by mouth 3 (three) times daily. 02/18/23  Yes [provider]  sertraline (ZOLOFT) 100 MG tablet Take 100 mg by mouth every morning.    [provider]      Allergies    Patient has no known allergies.    Review of Systems   Review of Systems  Cardiovascular:  Positive for syncope.    Physical Exam Updated Vital Signs BP 120/69   Pulse 72   Temp 97.7 F (36.5 C) (Oral)   Resp 20   Wt 77.1 kg   SpO2 99%   BMI 27.44 kg/m  Physical Exam Vitals reviewed.  HENT:     Head: Normocephalic and atraumatic.  Cardiovascular:     Rate and Rhythm: Normal rate and regular rhythm.  Pulmonary:     Effort: Pulmonary effort is normal.  Neurological:     General: No focal deficit present.     Mental Status: He is alert.     Gait: Gait normal.     ED Results / Procedures / Treatments   Labs (all labs ordered are listed, but only abnormal results are displayed) Labs Reviewed  COMPREHENSIVE  METABOLIC PANEL - Abnormal; Notable for the following components:      Result Value   Glucose, Bld 102 (*)    All other components within normal limits  CBC WITH DIFFERENTIAL/PLATELET  MAGNESIUM  RAPID URINE DRUG SCREEN, HOSP PERFORMED    EKG None  Radiology No results found.  Procedures Ultrasound ED Echo  Date/Time: 02/19/2023 11:14 AM  Performed by: Arletha Pili, DO Authorized by: Arletha Pili, DO   Procedure details:    Indications: syncope     Views: parasternal long axis view, parasternal short axis view and apical 4 chamber view     Images: archived   Findings:    Pericardium: no pericardial effusion     LV Function: normal (>50% EF)     RV Diameter: normal   Impression:    Impression: normal       Medications Ordered in ED Medications - No data to display  ED Course/ Medical Decision Making/ A&P                                 Medical Decision Making This is a 27 year old emergency department after a period of loss of consciousness.  Differential diagnoses include syncope, less  likely seizure.  Plan- patient's symptoms are most consistent with syncope.  He had a clear prodrome, no postictal period.  Could certainly be a focal seizure though based on his symptoms I think this is less likely.  My independent review the patient's EKG shows normal intervals, no Brugada type pattern, no epsilon wave, no ST 7 depressions or elevations, no T wave inversions.  Routine blood work ordered on the patient.  Reassessment 11:15 AM-bedside ultrasound normal on the patient.  EKG normal, blood work with no anemia, normal kidney function.  Patient reports that he actually did have cardiology follow-up 2 years ago where a Holter monitor for 1 week.  Will refer the patient to neurology.  Ultimately believe this is likely vasovagal syncope.    Amount and/or Complexity of Data Reviewed Labs: ordered.           Final Clinical Impression(s) / ED  Diagnoses Final diagnoses:  Syncope, unspecified syncope type    Rx / DC Orders ED Discharge Orders     None         Arletha Pili, DO 02/19/23 1118

## 2023-02-19 NOTE — ED Notes (Signed)
Awaiting physician to discuss results with patient

## 2023-02-19 NOTE — ED Notes (Signed)
Dc instructions reviewed with patient. Patient voiced understanding. Dc with belongings.  °

## 2023-02-19 NOTE — Discharge Instructions (Addendum)
While you are in the emergency department, you had blood work, an EKG and an ultrasound done.  These tests were all normal.  Included in your discharge paperwork is a telephone number for a neurologist.  I do recommend calling them this week to set up an appointment.  Please follow-up with your primary care doctor.  Return to the emergency department for repeated episodes.  Make sure you are drinking plenty of water over the next few days.

## 2023-02-19 NOTE — ED Triage Notes (Signed)
Pt has hx over past few years of having a "seizure" about once a year and it looks like his hands shake and he passes out. He has not seen any doctor for this as far as neuro. He has seen a cardiologist in past. Today he presents after having a "seizure " like episode, witnessed by a coworker. Pt passed out, hands twitching, disoriented ,vomited after and felt disoriented for few hours.

## 2023-03-04 DIAGNOSIS — F6381 Intermittent explosive disorder: Secondary | ICD-10-CM | POA: Diagnosis not present

## 2023-03-04 DIAGNOSIS — F411 Generalized anxiety disorder: Secondary | ICD-10-CM | POA: Diagnosis not present

## 2023-05-27 ENCOUNTER — Ambulatory Visit (INDEPENDENT_AMBULATORY_CARE_PROVIDER_SITE_OTHER): Payer: BC Managed Care – PPO | Admitting: Neurology

## 2023-05-27 ENCOUNTER — Encounter: Payer: Self-pay | Admitting: Neurology

## 2023-05-27 VITALS — BP 137/67 | HR 66 | Ht 67.0 in | Wt 182.0 lb

## 2023-05-27 DIAGNOSIS — R55 Syncope and collapse: Secondary | ICD-10-CM | POA: Diagnosis not present

## 2023-05-27 NOTE — Progress Notes (Signed)
 [facility]  PATIENT: Zachary Gill DOB: 26-Oct-1995  REQUESTING CLINICIAN: Fatima Sanger, MD HISTORY FROM: Patient  REASON FOR VISIT: Syncopal events    HISTORICAL  CHIEF COMPLAINT:  Chief Complaint  Patient presents with   Room 12    Pt is here Alone. Pt states that his first episode was 4-5 years ago where he fainted. Pt states that his last episode was 6 months ago when he was at work. Pt denies ever hitting his head when he faints. Pt states that he has a tingling feeling that will come over him before he will black out. Pt states that his last episode made him be out of it for the entire day and he felt weak.     HISTORY OF PRESENT ILLNESS:  This is a 28 year old gentleman past medical history of depression who is presenting with syncopal episodes.  Patient reports a total of 3 syncopes in the last 5 years. He tells me the first 1 of occurred 5 years ago, he was sitting down in his truck, had a syncopal episode, tells me that he was brief when he woke up he vomited.  Did not seek any medical attention.   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  The last 1 was 6 months ago.  He was sitting down again in his truck, car was off and he passed out.  The person seating next to him told him that he was stiff, afterward he vomited and reports that he was extremely tired and had to lay down for the rest of the day.  Denies any tongue biting, denies any urinary incontinence or any confusion following these events.  They are all preceded by the sense of passing out but again these all occurred while patient is sitting down.  Denies any previous history of seizures, no family history of seizures and no seizure risk factor reported.   OTHER MEDICAL CONDITIONS: Depression    REVIEW OF SYSTEMS: Full 14 system review of systems performed and negative  with exception of: As noted in the HPI   ALLERGIES: No Known Allergies  HOME MEDICATIONS: Outpatient Medications Prior to Visit  Medication Sig Dispense Refill   sertraline (ZOLOFT) 100 MG tablet Take 100 mg by mouth every morning.     ondansetron (ZOFRAN-ODT) 8 MG disintegrating tablet Take 8 mg by mouth 3 (three) times daily. (Patient not taking: Reported on 05/27/2023)     No facility-administered medications prior to visit.    PAST MEDICAL HISTORY: Past Medical History:  Diagnosis Date   Anxiety    Vitiligo     PAST SURGICAL HISTORY: Past Surgical History:  Procedure Laterality Date   LAPAROSCOPIC APPENDECTOMY N/A 08/01/2015   Procedure: APPENDECTOMY LAPAROSCOPIC;  Surgeon: Leonia Corona, MD;  Location: [facility] OR;  Service: Pediatrics;  Laterality: N/A;   LAPAROSCOPY N/A 11/08/2017   Procedure: LAPAROSCOPY DIAGNOSTIC;  Surgeon: Jimmye Norman, MD;  Location: [facility] OR;  Service: General;  Laterality: N/A;    FAMILY HISTORY: Family History  Problem Relation Age of Onset   Healthy Mother    Healthy Father    Healthy Brother    Lung cancer Neg Hx    Lung disease Neg Hx     SOCIAL HISTORY: Social History   Socioeconomic History   Marital status: Single    Spouse name: Not on file   Number of children: Not  on file   Years of education: Not on file   Highest education level: Not on file  Occupational History   Not on file  Tobacco Use   Smoking status: Never   Smokeless tobacco: Never   Tobacco comments:    quit 11/08/2017  Vaping Use   Vaping status: Former  Substance and Sexual Activity   Alcohol use: Not Currently    Comment: social   Drug use: Yes    Types: Marijuana    Comment: daily   Sexual activity: Yes    Birth control/protection: Condom  Other Topics Concern   Not on file  Social History Narrative   Not on file   Social Drivers of Health   Financial Resource Strain: Not on file  Food Insecurity: Not on file  Transportation Needs: Not on file   Physical Activity: Not on file  Stress: Not on file  Social Connections: Not on file  Intimate Partner Violence: Not on file    PHYSICAL EXAM  GENERAL EXAM/CONSTITUTIONAL: Vitals:  Vitals:   05/27/23 1359  BP: 137/67  Pulse: 66  Weight: 182 lb (82.6 kg)  Height: 5\' 7"  (1.702 m)   Body mass index is 28.51 kg/m. Wt Readings from Last 3 Encounters:  05/27/23 182 lb (82.6 kg)  02/19/23 170 lb (77.1 kg)  03/29/21 143 lb (64.9 kg)   Patient is in no distress; well developed, nourished and groomed; neck is supple  MUSCULOSKELETAL: Gait, strength, tone, movements noted in Neurologic exam below  NEUROLOGIC: MENTAL STATUS:      No data to display         awake, alert, oriented to person, place and time recent and remote memory intact normal attention and concentration language fluent, comprehension intact, naming intact fund of knowledge appropriate  CRANIAL NERVE:  2nd, 3rd, 4th, 6th - Visual fields full to confrontation, extraocular muscles intact, no nystagmus 5th - facial sensation symmetric 7th - facial strength symmetric 8th - hearing intact 9th - palate elevates symmetrically, uvula midline 11th - shoulder shrug symmetric 12th - tongue protrusion midline  MOTOR:  normal bulk and tone, full strength in the BUE, BLE  SENSORY:  normal and symmetric to light touch  COORDINATION:  finger-nose-finger, fine finger movements normal  GAIT/STATION:  normal     DIAGNOSTIC DATA (LABS, IMAGING, TESTING) - I reviewed patient records, labs, notes, testing and imaging myself where available.  Lab Results  Component Value Date   WBC 6.6 02/19/2023   HGB 15.7 02/19/2023   HCT 43.8 02/19/2023   MCV 94.4 02/19/2023   PLT 152 02/19/2023      Component Value Date/Time   NA 141 02/19/2023 0923   NA 142 10/10/2016 1610   K 4.7 02/19/2023 0923   K 4.0 10/10/2016 1610   CL 106 02/19/2023 0923   CO2 27 02/19/2023 0923   CO2 25 10/10/2016 1610   GLUCOSE 102  (H) 02/19/2023 0923   GLUCOSE 104 10/10/2016 1610   BUN 18 02/19/2023 0923   BUN 14.6 10/10/2016 1610   CREATININE 0.94 02/19/2023 0923   CREATININE 1.0 10/10/2016 1610   CALCIUM 9.3 02/19/2023 0923   CALCIUM 10.1 10/10/2016 1610   PROT 6.7 02/19/2023 0923   PROT 7.2 10/10/2016 1610   ALBUMIN 4.5 02/19/2023 0923   ALBUMIN 4.3 10/10/2016 1610   AST 24 02/19/2023 0923   AST 16 10/10/2016 1610   ALT 39 02/19/2023 0923   ALT 40 10/10/2016 1610   ALKPHOS 120 02/19/2023 1478  ALKPHOS 167 (H) 10/10/2016 1610   BILITOT 0.6 02/19/2023 0923   BILITOT 0.64 10/10/2016 1610   GFRNONAA >60 02/19/2023 0923   GFRAA >60 11/13/2017 0302   No results found for: "CHOL", "HDL", "LDLCALC", "LDLDIRECT", "TRIG", "CHOLHDL" No results found for: "HGBA1C" Lab Results  Component Value Date   VITAMINB12 364 10/10/2016   Lab Results  Component Value Date   TSH 1.892 10/10/2016     ASSESSMENT AND PLAN  28 y.o. year old male with depression who is presenting with events concerning for seizure.  This events are described as syncope but they do occur while patient is sitting down, followed by vomiting, and last episode with stiffness and tiredness.  Plan will be to obtain a routine EEG, if normal will obtain a 3-day ambulatory EEG to rule out seizures.  If any abnormality, will obtain MRI brain and discuss antiseizure medication.  This was discussed with patient and he is comfortable with plans.  Continue follow-up PCP and return sooner if worse.   1. Syncope, unspecified syncope type     Patient Instructions  Routine EEG, if normal will proceed with 3 days ambulatory EEG  Please contact me if you do have another event Continue with your current medications Continue follow-up PCP Return sooner if worse   Orders Placed This Encounter  Procedures   EEG adult    No orders of the defined types were placed in this encounter.   Return if symptoms worsen or fail to improve.    Windell Norfolk, MD  05/27/2023, 2:32 PM  [facility] [address] [place], [place] [address] [phone]

## 2023-05-27 NOTE — Patient Instructions (Signed)
 Routine EEG, if normal will proceed with 3 days ambulatory EEG  Please contact me if you do have another event Continue with your current medications Continue follow-up PCP Return sooner if worse

## 2023-06-09 ENCOUNTER — Ambulatory Visit (INDEPENDENT_AMBULATORY_CARE_PROVIDER_SITE_OTHER): Admitting: Neurology

## 2023-06-09 DIAGNOSIS — R55 Syncope and collapse: Secondary | ICD-10-CM

## 2023-06-09 NOTE — Procedures (Signed)
    History:  28 year old man with syncope   EEG classification: Awake and drowsy  Duration: 25 minutes   Technical aspects: This EEG study was done with scalp electrodes positioned according to the 10-20 International system of electrode placement. Electrical activity was reviewed with band pass filter of 1-70Hz , sensitivity of 7 uV/mm, display speed of 32mm/sec with a 60Hz  notched filter applied as appropriate. EEG data were recorded continuously and digitally stored.   Description of the recording: The background rhythms of this recording consists of a fairly well modulated medium amplitude alpha rhythm of 1 Hz that is reactive to eye opening and closure. Present in the anterior head region is a 15-20 Hz beta activity. Photic stimulation was performed, did not show any abnormalities. Hyperventilation was also performed, did not show any abnormalities. Drowsiness was manifested by background fragmentation. No abnormal epileptiform discharges seen during this recording. There was no focal slowing. There were no electrographic seizure identified.   Abnormality: None   Impression: This is a normal awake and drowsy EEG. No evidence of interictal epileptiform discharges. Normal EEGs, however, do not rule out epilepsy.    Windell Norfolk, MD [facility]

## 2023-06-10 ENCOUNTER — Encounter: Payer: Self-pay | Admitting: Neurology

## 2023-06-10 ENCOUNTER — Telehealth: Payer: Self-pay

## 2023-06-10 ENCOUNTER — Other Ambulatory Visit: Payer: Self-pay | Admitting: Neurology

## 2023-06-10 DIAGNOSIS — R55 Syncope and collapse: Secondary | ICD-10-CM

## 2023-08-08 ENCOUNTER — Emergency Department (HOSPITAL_BASED_OUTPATIENT_CLINIC_OR_DEPARTMENT_OTHER)
Admission: EM | Admit: 2023-08-08 | Discharge: 2023-08-08 | Disposition: A | Discharge status: Home / Self Care | Attending: Emergency Medicine | Admitting: Emergency Medicine

## 2023-08-08 ENCOUNTER — Other Ambulatory Visit: Payer: Self-pay

## 2023-08-08 ENCOUNTER — Encounter (HOSPITAL_BASED_OUTPATIENT_CLINIC_OR_DEPARTMENT_OTHER): Payer: Self-pay | Admitting: *Deleted

## 2023-08-08 DIAGNOSIS — R109 Unspecified abdominal pain: Secondary | ICD-10-CM | POA: Diagnosis not present

## 2023-08-08 DIAGNOSIS — R1110 Vomiting, unspecified: Secondary | ICD-10-CM | POA: Insufficient documentation

## 2023-08-08 DIAGNOSIS — D72829 Elevated white blood cell count, unspecified: Secondary | ICD-10-CM | POA: Insufficient documentation

## 2023-08-08 DIAGNOSIS — R197 Diarrhea, unspecified: Secondary | ICD-10-CM | POA: Diagnosis not present

## 2023-08-08 LAB — CBC
HCT: 46.2 % (ref 39.0–52.0)
Hemoglobin: 16.8 g/dL (ref 13.0–17.0)
MCH: 33.5 pg (ref 26.0–34.0)
MCHC: 36.4 g/dL — ABNORMAL HIGH (ref 30.0–36.0)
MCV: 92 fL (ref 80.0–100.0)
Platelets: 163 10*3/uL (ref 150–400)
RBC: 5.02 MIL/uL (ref 4.22–5.81)
RDW: 12.2 % (ref 11.5–15.5)
WBC: 13.6 10*3/uL — ABNORMAL HIGH (ref 4.0–10.5)
nRBC: 0 % (ref 0.0–0.2)

## 2023-08-08 LAB — LIPASE, BLOOD: Lipase: 13 U/L (ref 11–51)

## 2023-08-08 LAB — COMPREHENSIVE METABOLIC PANEL WITH GFR
ALT: 33 U/L (ref 0–44)
AST: 29 U/L (ref 15–41)
Albumin: 4.9 g/dL (ref 3.5–5.0)
Alkaline Phosphatase: 158 U/L — ABNORMAL HIGH (ref 38–126)
Anion gap: 15 (ref 5–15)
BUN: 24 mg/dL — ABNORMAL HIGH (ref 6–20)
CO2: 24 mmol/L (ref 22–32)
Calcium: 10.3 mg/dL (ref 8.9–10.3)
Chloride: 102 mmol/L (ref 98–111)
Creatinine, Ser: 0.98 mg/dL (ref 0.61–1.24)
GFR, Estimated: >60 mL/min (ref >60)
Glucose, Bld: 122 mg/dL — ABNORMAL HIGH (ref 70–99)
Potassium: 4.4 mmol/L (ref 3.5–5.1)
Sodium: 141 mmol/L (ref 135–145)
Total Bilirubin: 1.2 mg/dL (ref 0.0–1.2)
Total Protein: 7.5 g/dL (ref 6.5–8.1)

## 2023-08-08 MED ORDER — SODIUM CHLORIDE 0.9 % IV BOLUS
1000.0000 mL | Freq: Once | INTRAVENOUS | Status: AC
  Administered 2023-08-08: 1000 mL via INTRAVENOUS

## 2023-08-08 MED ORDER — ONDANSETRON HCL 4 MG/2ML IJ SOLN
4.0000 mg | Freq: Once | INTRAMUSCULAR | Status: AC | PRN
  Administered 2023-08-08: 4 mg via INTRAVENOUS
  Filled 2023-08-08: qty 2

## 2023-08-08 MED ORDER — ONDANSETRON 4 MG PO TBDP: 4.0000 mg | ORAL_TABLET | Freq: Three times a day (TID) | ORAL | 0 refills | Status: AC | PRN

## 2023-08-08 NOTE — ED Provider Notes (Signed)
 [place] EMERGENCY DEPARTMENT AT [facility] Provider Note   CSN: [id number] Arrival date & time: 08/08/23  4098     History  Chief Complaint  Patient presents with   Abdominal Pain    Zachary Gill is a 28 y.o. male.  The history is provided by the patient and a parent.  Patient with history of anxiety presents with abrupt onset of vomiting and diarrhea approximately 7 hours ago He reports he was at his baseline prior to going to bed, then woke up around 11 PM with the symptoms.  He reports up to 20 episodes of vomiting and diarrhea.  He denies seeing any blood in emesis or stool No recent travel.  No recent antibiotics.  No known sick contacts.  He ate [employer] for dinner but has never had this type of reaction previously   Past Medical History:  Diagnosis Date   Anxiety    Vitiligo     Home Medications Prior to Admission medications   Medication Sig Start Date End Date Taking? Authorizing Provider  ondansetron  (ZOFRAN -ODT) 4 MG disintegrating tablet Take 1 tablet (4 mg total) by mouth every 8 (eight) hours as needed. 08/08/23  Yes Eldon Greenland, MD  sertraline (ZOLOFT) 100 MG tablet Take 100 mg by mouth every morning.    [provider]      Allergies    Patient has no known allergies.    Review of Systems   Review of Systems  Constitutional:  Negative for fever.  Gastrointestinal:  Positive for abdominal pain, diarrhea and vomiting.    Physical Exam Updated Vital Signs BP 101/69 (BP Location: Right Arm)   Pulse 78   Temp 98.4 F (36.9 C) (Oral)   Resp 20   Ht 1.702 m (5\' 7" )   Wt 79.4 kg   SpO2 98%   BMI 27.41 kg/m  Physical Exam CONSTITUTIONAL: Well developed/well nourished HEAD: Normocephalic/atraumatic EYES: EOMI, no icterus ENMT: Mucous membranes moist NECK: supple no meningeal signs CV: S1/S2 noted, no murmurs/rubs/gallops noted LUNGS: Lungs are clear to auscultation bilaterally, no apparent distress ABDOMEN:  soft, nontender, no rebound or guarding, bowel sounds noted throughout abdomen NEURO: Pt is awake/alert/appropriate, moves all extremitiesx4.  No facial droop.   SKIN: Vitiligo PSYCH: no abnormalities of mood noted, alert and oriented to situation  ED Results / Procedures / Treatments   Labs (all labs ordered are listed, but only abnormal results are displayed) Labs Reviewed  COMPREHENSIVE METABOLIC PANEL WITH GFR - Abnormal; Notable for the following components:      Result Value   Glucose, Bld 122 (*)    BUN 24 (*)    Alkaline Phosphatase 158 (*)    All other components within normal limits  CBC - Abnormal; Notable for the following components:   WBC 13.6 (*)    MCHC 36.4 (*)    All other components within normal limits  LIPASE, BLOOD    EKG None  Radiology No results found.  Procedures Procedures    Medications Ordered in ED Medications  ondansetron  (ZOFRAN ) injection 4 mg (4 mg Intravenous Given 08/08/23 0545)  sodium chloride  0.9 % bolus 1,000 mL (1,000 mLs Intravenous New Bag/Given 08/08/23 0545)    ED Course/ Medical Decision Making/ A&P Clinical Course as of 08/08/23 0646  Fri Aug 08, 2023  0613 WBC(!): 13.6 Mild leukocytosis [DW]  0645 Patient present abrupt onset of nonbloody emesis and diarrhea. He had multiple episodes but at this point is feeling improved.  He is  responding to IV fluids and antiemetics.  He is drinking oral fluids and is in no acute distress.  He denies any pain at this time  Patient is safe for discharge home.  Short prescription of Zofran  has been provided. [DW]    Clinical Course User Index [DW] Eldon Greenland, MD                                 Medical Decision Making Amount and/or Complexity of Data Reviewed Labs: ordered. Decision-making details documented in ED Course.  Risk Prescription drug management.   This patient presents to the ED for concern of vomiting and diarrhea, this involves an extensive number of  treatment options, and is a complaint that carries with it a high risk of complications and morbidity.  The differential diagnosis includes but is not limited to cholecystitis, cholelithiasis, pancreatitis, gastritis, peptic ulcer disease, diverticulitis, ischemic bowel, gastroenteritis   Comorbidities that complicate the patient evaluation: Patient's presentation is complicated by their history of previous appendectomy, previous exploratory laparoscopy  Additional history obtained: Additional history obtained from family Records reviewed previous admission documents  Lab Tests: I Ordered, and personally interpreted labs.  The pertinent results include: Mild leukocytosis, mild dehydration  Medicines ordered and prescription drug management: I ordered medication including Zofran  for vomiting Reevaluation of the patient after these medicines showed that the patient    improved  Test Considered: CT abdomen pelvis was considered, but since patient is improving with history consistent with gastroenteritis will defer at this time  Reevaluation: After the interventions noted above, I reevaluated the patient and found that they have :improved  Complexity of problems addressed: Patient's presentation is most consistent with  acute presentation with potential threat to life or bodily function  Disposition: After consideration of the diagnostic results and the patient's response to treatment,  I feel that the patent would benefit from discharge  .           Final Clinical Impression(s) / ED Diagnoses Final diagnoses:  Vomiting and diarrhea    Rx / DC Orders ED Discharge Orders          Ordered    ondansetron  (ZOFRAN -ODT) 4 MG disintegrating tablet  Every 8 hours PRN        08/08/23 6295              Eldon Greenland, MD 08/08/23 [phone]

## 2023-08-08 NOTE — ED Triage Notes (Signed)
 Pt c/o n/v/d from 2300 to 0300 this. Denies any sick contacts. Unsure if he ate something that caused his symptoms. C/o general abd aching/sore.

## 2023-08-08 NOTE — Discharge Instructions (Signed)
°  SEEK IMMEDIATE MEDICAL ATTENTION IF: °The pain does not go away or becomes severe, particularly over the next 8-12 hours.  °A temperature above 100.4F develops.  °Repeated vomiting occurs (multiple episodes).  °The pain becomes localized to portions of the abdomen.  In an adult, the left lower portion of the abdomen could be colitis or diverticulitis.  °Blood is being passed in stools or vomit (bright red or black tarry stools).  °Return also if you develop chest pain, difficulty breathing, dizziness or fainting, or become confused, poorly responsive, or inconsolable. ° °

## 2023-09-29 DIAGNOSIS — F411 Generalized anxiety disorder: Secondary | ICD-10-CM | POA: Diagnosis not present

## 2023-09-29 DIAGNOSIS — F6381 Intermittent explosive disorder: Secondary | ICD-10-CM | POA: Diagnosis not present
